# Patient Record
Sex: Female | Born: 1980 | Race: White | Hispanic: No | Marital: Married | Smoking: Never smoker
Health system: Southern US, Community
[De-identification: ages and names within clinical notes are randomized; demographics above are authoritative.]

## PROBLEM LIST (undated history)

## (undated) DIAGNOSIS — R569 Unspecified convulsions: Secondary | ICD-10-CM

## (undated) DIAGNOSIS — J45909 Unspecified asthma, uncomplicated: Secondary | ICD-10-CM

## (undated) DIAGNOSIS — F419 Anxiety disorder, unspecified: Secondary | ICD-10-CM

## (undated) HISTORY — PX: DENTAL SURGERY: SHX609

---

## 2017-10-31 HISTORY — PX: OTHER SURGICAL HISTORY: SHX169

## 2021-01-28 ENCOUNTER — Emergency Department (HOSPITAL_COMMUNITY)
Admission: EM | Admit: 2021-01-28 | Discharge: 2021-01-28 | Disposition: A | Discharge status: Home / Self Care | Payer: BC Managed Care – PPO | Attending: Emergency Medicine | Admitting: Emergency Medicine

## 2021-01-28 ENCOUNTER — Other Ambulatory Visit: Payer: Self-pay

## 2021-01-28 ENCOUNTER — Encounter (HOSPITAL_COMMUNITY): Payer: Self-pay | Admitting: Pharmacy Technician

## 2021-01-28 DIAGNOSIS — R112 Nausea with vomiting, unspecified: Secondary | ICD-10-CM | POA: Insufficient documentation

## 2021-01-28 DIAGNOSIS — J45909 Unspecified asthma, uncomplicated: Secondary | ICD-10-CM | POA: Insufficient documentation

## 2021-01-28 DIAGNOSIS — R1084 Generalized abdominal pain: Secondary | ICD-10-CM | POA: Diagnosis not present

## 2021-01-28 HISTORY — DX: Unspecified asthma, uncomplicated: J45.909

## 2021-01-28 LAB — URINALYSIS, ROUTINE W REFLEX MICROSCOPIC
Bacteria, UA: NONE SEEN
Bilirubin Urine: NEGATIVE
Glucose, UA: NEGATIVE mg/dL
Ketones, ur: 20 mg/dL — AB
Leukocytes,Ua: NEGATIVE
Nitrite: NEGATIVE
Protein, ur: 30 mg/dL — AB
Specific Gravity, Urine: 1.026 (ref 1.005–1.030)
pH: 5 (ref 5.0–8.0)

## 2021-01-28 LAB — CBC
HCT: 38.5 % (ref 36.0–46.0)
Hemoglobin: 11.2 g/dL — ABNORMAL LOW (ref 12.0–15.0)
MCH: 22.8 pg — ABNORMAL LOW (ref 26.0–34.0)
MCHC: 29.1 g/dL — ABNORMAL LOW (ref 30.0–36.0)
MCV: 78.4 fL — ABNORMAL LOW (ref 80.0–100.0)
Platelets: 299 10*3/uL (ref 150–400)
RBC: 4.91 MIL/uL (ref 3.87–5.11)
RDW: 16.5 % — ABNORMAL HIGH (ref 11.5–15.5)
WBC: 8.3 10*3/uL (ref 4.0–10.5)
nRBC: 0 % (ref 0.0–0.2)

## 2021-01-28 LAB — COMPREHENSIVE METABOLIC PANEL
ALT: 13 U/L (ref 0–44)
AST: 16 U/L (ref 15–41)
Albumin: 4 g/dL (ref 3.5–5.0)
Alkaline Phosphatase: 73 U/L (ref 38–126)
Anion gap: 9 (ref 5–15)
BUN: 12 mg/dL (ref 6–20)
CO2: 23 mmol/L (ref 22–32)
Calcium: 8.7 mg/dL — ABNORMAL LOW (ref 8.9–10.3)
Chloride: 105 mmol/L (ref 98–111)
Creatinine, Ser: 0.71 mg/dL (ref 0.44–1.00)
GFR, Estimated: >60 mL/min (ref >60)
Glucose, Bld: 88 mg/dL (ref 70–99)
Potassium: 3.5 mmol/L (ref 3.5–5.1)
Sodium: 137 mmol/L (ref 135–145)
Total Bilirubin: 1.5 mg/dL — ABNORMAL HIGH (ref 0.3–1.2)
Total Protein: 7.1 g/dL (ref 6.5–8.1)

## 2021-01-28 LAB — I-STAT BETA HCG BLOOD, ED (MC, WL, AP ONLY): I-stat hCG, quantitative: <5 m[IU]/mL (ref <5)

## 2021-01-28 LAB — LIPASE, BLOOD: Lipase: 43 U/L (ref 11–51)

## 2021-01-28 MED ORDER — SODIUM CHLORIDE 0.9 % IV BOLUS
1000.0000 mL | Freq: Once | INTRAVENOUS | Status: AC
  Administered 2021-01-28: 1000 mL via INTRAVENOUS

## 2021-01-28 MED ORDER — ONDANSETRON HCL 4 MG/2ML IJ SOLN
4.0000 mg | Freq: Once | INTRAMUSCULAR | Status: AC
  Administered 2021-01-28: 4 mg via INTRAVENOUS
  Filled 2021-01-28: qty 2

## 2021-01-28 MED ORDER — ONDANSETRON 4 MG PO TBDP: 4.0000 mg | ORAL_TABLET | Freq: Three times a day (TID) | ORAL | 0 refills | Status: DC | PRN

## 2021-01-28 NOTE — ED Notes (Signed)
Patient given coke and straw with instructions to sip slowly.

## 2021-01-28 NOTE — ED Notes (Signed)
Patient with poor access, attempted several times to obtain PIV for fluids/meds. Unable to place PIV. PIV team order placed and MD updated.

## 2021-01-28 NOTE — ED Notes (Signed)
Pt tolerated PO fluids

## 2021-01-28 NOTE — ED Notes (Signed)
Pt given a ginger ale. Stated that she did not like the coke.

## 2021-01-28 NOTE — ED Triage Notes (Signed)
Pt here with intermittent abd pain onset Monday. Pt also endorses nausea and vomiting since then. Pt seen at a [facility] and sent here for appendicitis rule out.

## 2021-01-28 NOTE — ED Provider Notes (Signed)
[facility] EMERGENCY DEPARTMENT Provider Note   CSN: [id number] Arrival date & time: 01/28/21  1517     History Chief Complaint  Patient presents with  . Abdominal Pain    Tonya Melton is a 40 y.o. female.  The history is provided by the patient and medical records.  Abdominal Pain  Tonya Melton is a 40 y.o. female who presents to the Emergency Department complaining of vomiting. She presents the emergency department upon referral from the [facility] for evaluation of nausea and vomiting. She began feeling poorly three days ago with nausea and vomiting, three episodes of emesis daily. She reports subjective fevers at home with sweats. No significant abdominal pain. No diarrhea, dysuria, vaginal discharge. She has a history of asthma, no additional medical problems. Just started her cycle today. When [facility] was at [facility] on abdominal examination she did have some right lower quadrant tenderness in which she was referred to the emergency department for possible appendicitis. No known sick contacts. No known bad food exposures. No prior abdominal surgeries.    Past Medical History:  Diagnosis Date  . Asthma     There are no problems to display for this patient.   History reviewed. No pertinent surgical history.   OB History   No obstetric history on file.     No family history on file.     Home Medications Prior to Admission medications   Medication Sig Start Date End Date Taking? Authorizing Provider  ondansetron (ZOFRAN ODT) 4 MG disintegrating tablet Take 1 tablet (4 mg total) by mouth every 8 (eight) hours as needed for nausea or vomiting. 01/28/21  Yes Tilden Fossa, MD    Allergies    Amoxicillin, Doxycycline, and Penicillins  Review of Systems   Review of Systems  Gastrointestinal: Positive for abdominal pain.  All other systems reviewed and are negative.   Physical Exam Updated Vital Signs BP 131/84 (BP Location: Right Arm)    Pulse 77   Temp 98.5 F (36.9 C) (Oral)   Resp 20   SpO2 100%   Physical Exam Vitals and nursing note reviewed.  Constitutional:      Appearance: She is well-developed.  HENT:     Head: Normocephalic and atraumatic.  Cardiovascular:     Rate and Rhythm: Normal rate and regular rhythm.     Heart sounds: No murmur heard.   Pulmonary:     Effort: Pulmonary effort is normal. No respiratory distress.     Breath sounds: Normal breath sounds.  Abdominal:     Palpations: Abdomen is soft.     Tenderness: There is no guarding or rebound.     Comments: Mild generalized abdominal tenderness  Musculoskeletal:        General: No swelling or tenderness.  Skin:    General: Skin is warm and dry.  Neurological:     Mental Status: She is alert and oriented to person, place, and time.  Psychiatric:        Behavior: Behavior normal.     ED Results / Procedures / Treatments   Labs (all labs ordered are listed, but only abnormal results are displayed) Labs Reviewed  COMPREHENSIVE METABOLIC PANEL - Abnormal; Notable for the following components:      Result Value   Calcium 8.7 (*)    Total Bilirubin 1.5 (*)    All other components within normal limits  CBC - Abnormal; Notable for the following components:   Hemoglobin 11.2 (*)  MCV 78.4 (*)    MCH 22.8 (*)    MCHC 29.1 (*)    RDW 16.5 (*)    All other components within normal limits  URINALYSIS, ROUTINE W REFLEX MICROSCOPIC - Abnormal; Notable for the following components:   APPearance HAZY (*)    Hgb urine dipstick MODERATE (*)    Ketones, ur 20 (*)    Protein, ur 30 (*)    All other components within normal limits  LIPASE, BLOOD  I-STAT BETA HCG BLOOD, ED ([facility], [facility], [facility] ONLY)    EKG None  Radiology No results found.  Procedures Procedures   Medications Ordered in ED Medications  sodium chloride 0.9 % bolus 1,000 mL (0 mLs Intravenous Stopped 01/28/21 2106)  ondansetron (ZOFRAN) injection 4 mg (4 mg Intravenous  Given 01/28/21 1941)    ED Course  I have reviewed the triage vital signs and the nursing notes.  Pertinent labs & imaging results that were available during my care of the patient were reviewed by me and considered in my medical decision making (see chart for details).    MDM Rules/Calculators/A&P                         patient here for evaluation of three days of vomiting. She did have abdominal tenderness at urgent care. She has minimal tenderness on examination without any focal peritoneal findings. Labs are reassuring. UA is not consistent with UTI. Feel appendicitis, cholecystitis, or diverticulitis are unlikely at this time. She was treated with antiemetic and IV fluid hydration. On reassessment she is feeling improved, able to tolerate PO. Plan to discharge home with home care for nausea, vomiting. Discussed outpatient follow-up and return precautions.  Final Clinical Impression(s) / ED Diagnoses Final diagnoses:  Non-intractable vomiting with nausea, unspecified vomiting type    Rx / DC Orders ED Discharge Orders         Ordered    ondansetron (ZOFRAN ODT) 4 MG disintegrating tablet  Every 8 hours PRN        01/28/21 2032           Tilden Fossa, MD 01/28/21 2325

## 2021-07-12 ENCOUNTER — Encounter (HOSPITAL_COMMUNITY): Payer: Self-pay

## 2021-07-12 ENCOUNTER — Emergency Department (HOSPITAL_COMMUNITY)
Admission: EM | Admit: 2021-07-12 | Discharge: 2021-07-12 | Disposition: A | Discharge status: Home / Self Care | Payer: Self-pay | Attending: Emergency Medicine | Admitting: Emergency Medicine

## 2021-07-12 ENCOUNTER — Other Ambulatory Visit: Payer: Self-pay

## 2021-07-12 ENCOUNTER — Emergency Department (HOSPITAL_COMMUNITY): Payer: Self-pay

## 2021-07-12 DIAGNOSIS — J069 Acute upper respiratory infection, unspecified: Secondary | ICD-10-CM | POA: Insufficient documentation

## 2021-07-12 DIAGNOSIS — J45909 Unspecified asthma, uncomplicated: Secondary | ICD-10-CM | POA: Insufficient documentation

## 2021-07-12 DIAGNOSIS — Z20822 Contact with and (suspected) exposure to covid-19: Secondary | ICD-10-CM | POA: Insufficient documentation

## 2021-07-12 HISTORY — DX: Anxiety disorder, unspecified: F41.9

## 2021-07-12 LAB — RESP PANEL BY RT-PCR (FLU A&B, COVID) ARPGX2
Influenza A by PCR: NEGATIVE
Influenza B by PCR: NEGATIVE
SARS Coronavirus 2 by RT PCR: NEGATIVE

## 2021-07-12 MED ORDER — ONDANSETRON 4 MG PO TBDP
4.0000 mg | ORAL_TABLET | Freq: Once | ORAL | Status: AC
  Administered 2021-07-12: 4 mg via ORAL
  Filled 2021-07-12: qty 1

## 2021-07-12 MED ORDER — BENZONATATE 100 MG PO CAPS: 100.0000 mg | ORAL_CAPSULE | Freq: Three times a day (TID) | ORAL | 0 refills | Status: DC

## 2021-07-12 MED ORDER — ONDANSETRON 4 MG PO TBDP: 4.0000 mg | ORAL_TABLET | Freq: Three times a day (TID) | ORAL | 0 refills | Status: DC | PRN

## 2021-07-12 NOTE — Discharge Instructions (Addendum)
As we discussed, your chest x-ray showed no evidence of pneumonia. I think your symptoms are likely due a viral respiratory illness. Your COVID and flu tests are pending and should result within the next day or so. If they are positive, please follow CDC guidelines for isolation precautions. You can follow up with your primary care provider if symptoms are persistent.  I'm writing you prescriptions for zofran for nausea and tessalon for cough. You can keep taking cold and flu medicine and mucinex with lots of water.  Continue to monitor your symptoms and return to ED for new or worsening difficulty breathing.

## 2021-07-12 NOTE — ED Provider Notes (Signed)
[facility]-EMERGENCY DEPT Provider Note   CSN: [id number] Arrival date & time: 07/12/21  5170     History Chief Complaint  Patient presents with   Cough   Sore Throat   muscle aches   Headache    Tonya Melton is a 40 y.o. female with hx of asthma presents with productive cough, congestion, sore throat x 3 days. Associated nausea and body aches. Reports pleuritic CP and abdominal soreness. Pt has been taking tylenol cold and flu, mucinex, and home nebulizer without relief. She has felt feverish, highest recorded temperature 99.67F. One episode of vomiting of green mucus. Denies sick contacts. Works as a Manufacturing systems engineer.    Cough Associated symptoms: chest pain, chills, fever, headaches, myalgias, shortness of breath and sore throat   Sore Throat Associated symptoms include chest pain, headaches and shortness of breath.  Headache Associated symptoms: congestion, cough, fever, myalgias, nausea, sore throat and vomiting   Associated symptoms: no diarrhea       Past Medical History:  Diagnosis Date   Anxiety    Asthma     There are no problems to display for this patient.   Past Surgical History:  Procedure Laterality Date   deviated septum  2019     OB History   No obstetric history on file.     History reviewed. No pertinent family history.  Social History   Tobacco Use   Smoking status: Never   Smokeless tobacco: Never  Vaping Use   Vaping Use: Never used  Substance Use Topics   Alcohol use: Yes   Drug use: Never    Home Medications Prior to Admission medications   Medication Sig Start Date End Date Taking? Authorizing Provider  benzonatate (TESSALON) 100 MG capsule Take 1 capsule (100 mg total) by mouth every 8 (eight) hours. 07/12/21  Yes Edwards Mckelvie T, PA-C  ondansetron (ZOFRAN ODT) 4 MG disintegrating tablet Take 1 tablet (4 mg total) by mouth every 8 (eight) hours as needed for nausea or vomiting. 07/12/21  Yes  Tristy Udovich T, PA-C    Allergies    Amoxicillin, Biaxin [clarithromycin], Doxycycline, and Penicillins  Review of Systems   Review of Systems  Constitutional:  Positive for chills and fever.  HENT:  Positive for congestion and sore throat.   Respiratory:  Positive for cough and shortness of breath.   Cardiovascular:  Positive for chest pain.       Pleuritic CP  Gastrointestinal:  Positive for nausea and vomiting. Negative for constipation and diarrhea.  Musculoskeletal:  Positive for myalgias.  Neurological:  Positive for headaches.  All other systems reviewed and are negative.  Physical Exam Updated Vital Signs BP 123/84   Pulse 95   Temp 98.2 F (36.8 C) (Oral)   Resp 18   Ht 5' (1.524 m)   Wt 90.7 kg   LMP 06/21/2021 (Exact Date)   SpO2 98%   BMI 39.06 kg/m   Physical Exam Vitals and nursing note reviewed.  Constitutional:      Appearance: Normal appearance.  HENT:     Head: Normocephalic and atraumatic.     Nose: Congestion present.     Mouth/Throat:     Mouth: Mucous membranes are moist.     Pharynx: No oropharyngeal exudate or posterior oropharyngeal erythema.  Eyes:     Conjunctiva/sclera: Conjunctivae normal.  Cardiovascular:     Rate and Rhythm: Normal rate and regular rhythm.  Pulmonary:     Effort: Pulmonary effort is  normal. No respiratory distress.     Breath sounds: Normal breath sounds.  Abdominal:     General: There is no distension.     Palpations: Abdomen is soft.     Tenderness: There is no abdominal tenderness.  Skin:    General: Skin is warm and dry.  Neurological:     General: No focal deficit present.     Mental Status: She is alert.    ED Results / Procedures / Treatments   Labs (all labs ordered are listed, but only abnormal results are displayed) Labs Reviewed  RESP PANEL BY RT-PCR (FLU A&B, COVID) [id number]    EKG None  Radiology DG Chest 2 View  Result Date: 07/12/2021 CLINICAL DATA:  Cough and shortness of breath.  Nonsmoker with asthma. EXAM: CHEST - 2 VIEW COMPARISON:  None. FINDINGS: Midline trachea.  Normal heart size and mediastinal contours. Sharp costophrenic angles.  No pneumothorax.  Clear lungs. IMPRESSION: No active cardiopulmonary disease. Electronically Signed   By: Jeronimo Greaves M.D.   On: 07/12/2021 13:35    Procedures Procedures   Medications Ordered in ED Medications  ondansetron (ZOFRAN-ODT) disintegrating tablet 4 mg (4 mg Oral Given 07/12/21 1340)    ED Course  I have reviewed the triage vital signs and the nursing notes.  Pertinent labs & imaging results that were available during my care of the patient were reviewed by me and considered in my medical decision making (see chart for details).    MDM Rules/Calculators/A&P                           Patient is 40 y/o female with hx of asthma who presents with 3 days of cough, congestion, sore throat, and body aches. On exam she is afebrile, good air movement with intermittent coughing. Lungs sound clear to auscultation bilaterally. Oxygen saturation 99% on room air.  CXR shows no active cardiopulmonary disease. COVID and flu tests pending. Symptoms likely due to respiratory viral infection. Discussed reassuring imaging, pending test results, and outpatient treatment with over the counter medications. Patient can follow up with PCP if interested in starting paxlovid. Writing prescriptions for tessalon and zofran. Patient is stable for discharge. Discussed reasons to return to ED. Patient agreeable to plan.  Final Clinical Impression(s) / ED Diagnoses Final diagnoses:  Viral URI with cough    Rx / DC Orders ED Discharge Orders          Ordered    benzonatate (TESSALON) 100 MG capsule  Every 8 hours        07/12/21 1354    ondansetron (ZOFRAN ODT) 4 MG disintegrating tablet  Every 8 hours PRN        07/12/21 1354             Andrika Peraza T, PA-C 07/12/21 1358    Wynetta Fines, MD 07/16/21 0900

## 2021-08-07 ENCOUNTER — Other Ambulatory Visit: Payer: Self-pay

## 2021-08-07 ENCOUNTER — Emergency Department (HOSPITAL_COMMUNITY)
Admission: EM | Admit: 2021-08-07 | Discharge: 2021-08-07 | Disposition: A | Discharge status: Home / Self Care | Payer: Self-pay | Attending: Emergency Medicine | Admitting: Emergency Medicine

## 2021-08-07 ENCOUNTER — Encounter (HOSPITAL_COMMUNITY): Payer: Self-pay | Admitting: Emergency Medicine

## 2021-08-07 ENCOUNTER — Emergency Department (HOSPITAL_COMMUNITY): Payer: Self-pay

## 2021-08-07 DIAGNOSIS — D649 Anemia, unspecified: Secondary | ICD-10-CM | POA: Insufficient documentation

## 2021-08-07 DIAGNOSIS — J4541 Moderate persistent asthma with (acute) exacerbation: Secondary | ICD-10-CM | POA: Insufficient documentation

## 2021-08-07 DIAGNOSIS — N9489 Other specified conditions associated with female genital organs and menstrual cycle: Secondary | ICD-10-CM | POA: Insufficient documentation

## 2021-08-07 DIAGNOSIS — J019 Acute sinusitis, unspecified: Secondary | ICD-10-CM | POA: Insufficient documentation

## 2021-08-07 LAB — BASIC METABOLIC PANEL
Anion gap: 9 (ref 5–15)
BUN: 10 mg/dL (ref 6–20)
CO2: 27 mmol/L (ref 22–32)
Calcium: 8.9 mg/dL (ref 8.9–10.3)
Chloride: 102 mmol/L (ref 98–111)
Creatinine, Ser: 0.76 mg/dL (ref 0.44–1.00)
GFR, Estimated: >60 mL/min (ref >60)
Glucose, Bld: 125 mg/dL — ABNORMAL HIGH (ref 70–99)
Potassium: 3.7 mmol/L (ref 3.5–5.1)
Sodium: 138 mmol/L (ref 135–145)

## 2021-08-07 LAB — CBC WITH DIFFERENTIAL/PLATELET
Abs Immature Granulocytes: 0.03 10*3/uL (ref 0.00–0.07)
Basophils Absolute: 0 10*3/uL (ref 0.0–0.1)
Basophils Relative: 0 %
Eosinophils Absolute: 0.3 10*3/uL (ref 0.0–0.5)
Eosinophils Relative: 5 %
HCT: 34 % — ABNORMAL LOW (ref 36.0–46.0)
Hemoglobin: 9.9 g/dL — ABNORMAL LOW (ref 12.0–15.0)
Immature Granulocytes: 1 %
Lymphocytes Relative: 40 %
Lymphs Abs: 2 10*3/uL (ref 0.7–4.0)
MCH: 21.5 pg — ABNORMAL LOW (ref 26.0–34.0)
MCHC: 29.1 g/dL — ABNORMAL LOW (ref 30.0–36.0)
MCV: 73.9 fL — ABNORMAL LOW (ref 80.0–100.0)
Monocytes Absolute: 0.8 10*3/uL (ref 0.1–1.0)
Monocytes Relative: 15 %
Neutro Abs: 2 10*3/uL (ref 1.7–7.7)
Neutrophils Relative %: 39 %
Platelets: 221 10*3/uL (ref 150–400)
RBC: 4.6 MIL/uL (ref 3.87–5.11)
RDW: 17.3 % — ABNORMAL HIGH (ref 11.5–15.5)
WBC: 5.2 10*3/uL (ref 4.0–10.5)
nRBC: 0 % (ref 0.0–0.2)

## 2021-08-07 LAB — I-STAT BETA HCG BLOOD, ED (MC, WL, AP ONLY): I-stat hCG, quantitative: <5 m[IU]/mL (ref <5)

## 2021-08-07 MED ORDER — ALBUTEROL SULFATE HFA 108 (90 BASE) MCG/ACT IN AERS
2.0000 | INHALATION_SPRAY | Freq: Once | RESPIRATORY_TRACT | Status: AC
  Administered 2021-08-07: 2 via RESPIRATORY_TRACT
  Filled 2021-08-07: qty 6.7

## 2021-08-07 MED ORDER — ALBUTEROL SULFATE (2.5 MG/3ML) 0.083% IN NEBU
2.5000 mg | INHALATION_SOLUTION | Freq: Once | RESPIRATORY_TRACT | Status: AC
  Administered 2021-08-07: 2.5 mg via RESPIRATORY_TRACT
  Filled 2021-08-07: qty 3

## 2021-08-07 MED ORDER — METHYLPREDNISOLONE SODIUM SUCC 125 MG IJ SOLR
125.0000 mg | Freq: Once | INTRAMUSCULAR | Status: AC
  Administered 2021-08-07: 125 mg via INTRAMUSCULAR
  Filled 2021-08-07: qty 2

## 2021-08-07 MED ORDER — BENZONATATE 200 MG PO CAPS: 200.0000 mg | ORAL_CAPSULE | Freq: Three times a day (TID) | ORAL | 0 refills | Status: AC

## 2021-08-07 MED ORDER — PREDNISONE 10 MG PO TABS: 50.0000 mg | ORAL_TABLET | Freq: Every day | ORAL | 0 refills | Status: AC

## 2021-08-07 MED ORDER — SULFAMETHOXAZOLE-TRIMETHOPRIM 800-160 MG PO TABS: 1.0000 | ORAL_TABLET | Freq: Two times a day (BID) | ORAL | 0 refills | Status: AC

## 2021-08-07 MED ORDER — ALBUTEROL SULFATE (5 MG/ML) 0.5% IN NEBU: 2.5000 mg | INHALATION_SOLUTION | Freq: Four times a day (QID) | RESPIRATORY_TRACT | 12 refills | Status: DC | PRN

## 2021-08-07 NOTE — ED Provider Notes (Signed)
[facility] EMERGENCY DEPARTMENT Provider Note   CSN: [id number] Arrival date & time: 08/07/21  0556     History Chief Complaint  Patient presents with   Cough / Wheezing     Tonya Melton is a 40 y.o. female.  40 year old female brought in by EMS for cough with wheezing x3 weeks.  Cough is productive, also reports sinus drainage and congestion.  Patient is using her rescue inhaler without relief.  Patient was given a DuoNeb by EMS with limited relief.  Has not had steroids.  She denies fevers, sick contacts.  No other complaints or concerns.      Past Medical History:  Diagnosis Date   Anxiety    Asthma     There are no problems to display for this patient.   Past Surgical History:  Procedure Laterality Date   deviated septum  2019     OB History   No obstetric history on file.     No family history on file.  Social History   Tobacco Use   Smoking status: Never   Smokeless tobacco: Never  Vaping Use   Vaping Use: Never used  Substance Use Topics   Alcohol use: Yes   Drug use: Never    Home Medications Prior to Admission medications   Medication Sig Start Date End Date Taking? Authorizing Provider  albuterol (PROVENTIL) (5 MG/ML) 0.5% nebulizer solution Take 0.5 mLs (2.5 mg total) by nebulization every 6 (six) hours as needed for wheezing or shortness of breath. 08/07/21  Yes Jeannie Fend, PA-C  benzonatate (TESSALON) 200 MG capsule Take 1 capsule (200 mg total) by mouth every 8 (eight) hours for 10 days. 08/07/21 08/17/21 Yes Jeannie Fend, PA-C  predniSONE (DELTASONE) 10 MG tablet Take 5 tablets (50 mg total) by mouth daily for 5 days. 08/07/21 08/12/21 Yes Jeannie Fend, PA-C  sulfamethoxazole-trimethoprim (BACTRIM DS) 800-160 MG tablet Take 1 tablet by mouth 2 (two) times daily for 7 days. 08/07/21 08/14/21 Yes Jeannie Fend, PA-C  ondansetron (ZOFRAN ODT) 4 MG disintegrating tablet Take 1 tablet (4 mg total) by mouth every 8  (eight) hours as needed for nausea or vomiting. 07/12/21   Roemhildt, Lorin T, PA-C    Allergies    Amoxicillin, Biaxin [clarithromycin], Doxycycline, and Penicillins  Review of Systems   Review of Systems  Constitutional:  Negative for chills and fever.  HENT:  Positive for congestion and postnasal drip.   Respiratory:  Positive for cough, shortness of breath and wheezing.   Cardiovascular:  Negative for chest pain.  Gastrointestinal:  Negative for nausea and vomiting.  Musculoskeletal:  Negative for arthralgias and myalgias.  Skin:  Negative for rash and wound.  Allergic/Immunologic: Negative for immunocompromised state.  Neurological:  Negative for weakness.  Hematological:  Negative for adenopathy.  Psychiatric/Behavioral:  Negative for confusion.   All other systems reviewed and are negative.  Physical Exam Updated Vital Signs BP 130/80 (BP Location: Right Arm)   Pulse (!) 104   Temp 98.5 F (36.9 C) (Oral)   Resp 17   Ht 5' (1.524 m)   Wt 108 kg   SpO2 98%   BMI 46.50 kg/m   Physical Exam Vitals and nursing note reviewed.  Constitutional:      General: She is not in acute distress.    Appearance: She is well-developed. She is not diaphoretic.  HENT:     Head: Normocephalic and atraumatic.     Nose: Congestion  present.     Mouth/Throat:     Mouth: Mucous membranes are moist.  Eyes:     Conjunctiva/sclera: Conjunctivae normal.  Cardiovascular:     Rate and Rhythm: Normal rate and regular rhythm.     Heart sounds: Normal heart sounds.  Pulmonary:     Effort: Pulmonary effort is normal.     Breath sounds: Normal breath sounds.  Musculoskeletal:     Cervical back: Neck supple.  Skin:    General: Skin is warm and dry.     Findings: No erythema or rash.  Neurological:     Mental Status: She is alert and oriented to person, place, and time.  Psychiatric:        Behavior: Behavior normal.    ED Results / Procedures / Treatments   Labs (all labs ordered are  listed, but only abnormal results are displayed) Labs Reviewed  CBC WITH DIFFERENTIAL/PLATELET - Abnormal; Notable for the following components:      Result Value   Hemoglobin 9.9 (*)    HCT 34.0 (*)    MCV 73.9 (*)    MCH 21.5 (*)    MCHC 29.1 (*)    RDW 17.3 (*)    All other components within normal limits  BASIC METABOLIC PANEL - Abnormal; Notable for the following components:   Glucose, Bld 125 (*)    All other components within normal limits  I-STAT BETA HCG BLOOD, ED ([facility], [facility], [facility] ONLY)    EKG None  Radiology DG Chest 2 View  Result Date: 08/07/2021 CLINICAL DATA:  40 year old female with productive cough and wheezing for 3 weeks. EXAM: CHEST - 2 VIEW COMPARISON:  Chest radiographs 07/12/2021. FINDINGS: Stable lung volumes overall, lower on the PA view today. Normal cardiac size and mediastinal contours. Visualized tracheal air column is within normal limits. No pneumothorax, pulmonary edema, pleural effusion or confluent pulmonary opacity. Lung markings are within normal limits. No osseous abnormality identified. Negative visible bowel gas pattern. IMPRESSION: Negative.  No acute cardiopulmonary abnormality. Electronically Signed   By: Odessa Fleming M.D.   On: 08/07/2021 07:13    Procedures Procedures   Medications Ordered in ED Medications  albuterol (VENTOLIN HFA) 108 (90 Base) MCG/ACT inhaler 2 puff (2 puffs Inhalation Given 08/07/21 0605)  methylPREDNISolone sodium succinate (SOLU-MEDROL) 125 mg/2 mL injection 125 mg (125 mg Intramuscular Given 08/07/21 0804)  albuterol (PROVENTIL) (2.5 MG/3ML) 0.083% nebulizer solution 2.5 mg (2.5 mg Nebulization Given 08/07/21 0804)    ED Course  I have reviewed the triage vital signs and the nursing notes.  Pertinent labs & imaging results that were available during my care of the patient were reviewed by me and considered in my medical decision making (see chart for details).  Clinical Course as of 08/07/21 1607  Sat Aug 07, 2021  6056  40 year old female with history of asthma with presentation as above.  At time of exam, has frequent dry cough.  Has postnasal drip, lungs are clear to auscultation. Reviewed results with patient, her chest x-ray is unremarkable.  CBC with anemia with hemoglobin of 9.9, previously 11.  hCG negative.  BMP without significant lateral arrangement. Plan is to treat with albuterol inhaler and IM dose of Solu-Medrol and reassess.  If patient is feeling better, she may be discharged with prednisone for the next 5 days, if not improving, may be held for additional evaluation and treatment. [LM]  0831 Repeat lung exam, CTA. Patient appears to be feeling much better. Reports frustration with feeling  sick since 9/10 without improvement. 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 Patient is getting married in 2 weeks, discussed trying to control asthma, limit further viral exposure with mask wearing as much as possible. Multiple drug allergies (is taking Keflex currently without improvement), given Bactrim to take if not improving with discussion that this is likely viral and abx will not be helpful.  [LM]    Clinical Course User Index [LM] Alden Hipp   MDM Rules/Calculators/A&P                            Final Clinical Impression(s) / ED Diagnoses Final diagnoses:  Moderate persistent asthma with exacerbation  Acute non-recurrent sinusitis, unspecified location    Rx / DC Orders ED Discharge Orders          Ordered    predniSONE (DELTASONE) 10 MG tablet  Daily        08/07/21 0829    sulfamethoxazole-trimethoprim (BACTRIM DS) 800-160 MG tablet  2 times daily        08/07/21 0829    benzonatate (TESSALON) 200 MG capsule  Every 8 hours        08/07/21 0829    albuterol (PROVENTIL) (5 MG/ML) 0.5% nebulizer solution  Every 6 hours PRN        08/07/21 0830             Jeannie Fend, PA-C 08/07/21  3007    Gerhard Munch, MD 08/07/21 [phone]

## 2021-08-07 NOTE — ED Triage Notes (Signed)
Patient arrived with EMS from home reports persistent productive cough with wheezing for 3 weeks unrelieved by rescue inhaler at home , denies fever or chills . History of asthma . She received Duoneb treatment by EMS prior to arrival .

## 2021-08-07 NOTE — ED Notes (Signed)
Discharged by PA at triage. 

## 2021-08-07 NOTE — Discharge Instructions (Addendum)
Likely recurrent viral illness due to repeat exposure at work. Recommend prednisone as prescribed and complete the full course. Albuterol nebs as needed as prescribed. Continue with inhaler, singulair, zyrtec, flonase. Saline sinus rinse twice daily. Wear a mask as much as possible at work to limit exposure.  If not improving, take Bactrim as prescribed and complete the full course.  Recheck with your doctor.

## 2021-08-07 NOTE — ED Notes (Signed)
Pt back to triage for PA to reassess.

## 2021-09-24 ENCOUNTER — Emergency Department (HOSPITAL_COMMUNITY)
Admission: EM | Admit: 2021-09-24 | Discharge: 2021-09-25 | Disposition: A | Discharge status: Home / Self Care | Payer: Self-pay | Attending: Medical | Admitting: Medical

## 2021-09-24 ENCOUNTER — Other Ambulatory Visit: Payer: Self-pay

## 2021-09-24 ENCOUNTER — Encounter (HOSPITAL_COMMUNITY): Payer: Self-pay | Admitting: Emergency Medicine

## 2021-09-24 DIAGNOSIS — O26891 Other specified pregnancy related conditions, first trimester: Secondary | ICD-10-CM | POA: Insufficient documentation

## 2021-09-24 DIAGNOSIS — Z5321 Procedure and treatment not carried out due to patient leaving prior to being seen by health care provider: Secondary | ICD-10-CM | POA: Insufficient documentation

## 2021-09-24 DIAGNOSIS — N83201 Unspecified ovarian cyst, right side: Secondary | ICD-10-CM | POA: Diagnosis not present

## 2021-09-24 DIAGNOSIS — Z3A01 Less than 8 weeks gestation of pregnancy: Secondary | ICD-10-CM | POA: Diagnosis not present

## 2021-09-24 DIAGNOSIS — R109 Unspecified abdominal pain: Secondary | ICD-10-CM | POA: Insufficient documentation

## 2021-09-24 DIAGNOSIS — O219 Vomiting of pregnancy, unspecified: Secondary | ICD-10-CM | POA: Diagnosis not present

## 2021-09-24 NOTE — ED Provider Notes (Signed)
Emergency Medicine Provider Triage Evaluation Note  Tonya Melton , a 40 y.o. female  was evaluated in triage.  Pt complains of nausea and non D nonbilious emesis for the past 2 days.  She reports she is approximately [redacted] weeks pregnant.  She states that she has been constipated for approximately 1 week.  She is having small pellet stools however has not had a good BM in over a week.  She has tried Colace without relief.  She does complain of some slight abdominal cramping today however attributes it to feeling bloated and constipated.  This is her first pregnancy.  She has not had a confirmatory ultrasound.  Last menstrual cycle 08/22.  Denies any vaginal bleeding.   Review of Systems  Positive: + abd pain, constipation, nausea, vomiting Negative: - vaginal  bleeding, urinary symptoms  Physical Exam  BP (!) 151/102 (BP Location: Right Arm)   Pulse 93   Temp 98.3 F (36.8 C) (Oral)   Resp 18   Ht 5' (1.524 m)   Wt 108.9 kg   LMP 06/21/2021 (Exact Date)   SpO2 99%   BMI 46.87 kg/m  Gen:   Awake, no distress   Resp:  Normal effort  MSK:   Moves extremities without difficulty  Other:  + mild diffuse abd TTP  Medical Decision Making  Medically screening exam initiated at 11:09 PM.  Appropriate orders placed.  Tonya Melton was informed that the remainder of the evaluation will be completed by another provider, this initial triage assessment does not replace that evaluation, and the importance of remaining in the ED until their evaluation is complete.     Tanda Rockers, PA-C 09/24/21 2309    Dione Booze, MD 09/25/21 [phone]

## 2021-09-24 NOTE — ED Triage Notes (Addendum)
Patient reports she is approximately [redacted] weeks pregnant, reports abdominal cramping and multiple episodes of emesis today. States she not been able to keep fluids down. She reports being concerned about dehydration. Reports no relief with PO zofran.

## 2021-09-25 ENCOUNTER — Emergency Department (HOSPITAL_COMMUNITY): Payer: Self-pay

## 2021-09-25 DIAGNOSIS — N83201 Unspecified ovarian cyst, right side: Secondary | ICD-10-CM | POA: Diagnosis not present

## 2021-09-25 DIAGNOSIS — O26891 Other specified pregnancy related conditions, first trimester: Secondary | ICD-10-CM | POA: Diagnosis not present

## 2021-09-25 DIAGNOSIS — Z3A01 Less than 8 weeks gestation of pregnancy: Secondary | ICD-10-CM | POA: Diagnosis not present

## 2021-09-25 LAB — COMPREHENSIVE METABOLIC PANEL
ALT: 15 U/L (ref 0–44)
AST: 17 U/L (ref 15–41)
Albumin: 4.2 g/dL (ref 3.5–5.0)
Alkaline Phosphatase: 70 U/L (ref 38–126)
Anion gap: 9 (ref 5–15)
BUN: 14 mg/dL (ref 6–20)
CO2: 24 mmol/L (ref 22–32)
Calcium: 8.8 mg/dL — ABNORMAL LOW (ref 8.9–10.3)
Chloride: 104 mmol/L (ref 98–111)
Creatinine, Ser: 0.67 mg/dL (ref 0.44–1.00)
GFR, Estimated: >60 mL/min (ref >60)
Glucose, Bld: 91 mg/dL (ref 70–99)
Potassium: 3.7 mmol/L (ref 3.5–5.1)
Sodium: 137 mmol/L (ref 135–145)
Total Bilirubin: 0.7 mg/dL (ref 0.3–1.2)
Total Protein: 7.6 g/dL (ref 6.5–8.1)

## 2021-09-25 LAB — CBC WITH DIFFERENTIAL/PLATELET
Abs Immature Granulocytes: 0.04 10*3/uL (ref 0.00–0.07)
Basophils Absolute: 0.1 10*3/uL (ref 0.0–0.1)
Basophils Relative: 1 %
Eosinophils Absolute: 0.2 10*3/uL (ref 0.0–0.5)
Eosinophils Relative: 2 %
HCT: 36.2 % (ref 36.0–46.0)
Hemoglobin: 10.8 g/dL — ABNORMAL LOW (ref 12.0–15.0)
Immature Granulocytes: 0 %
Lymphocytes Relative: 29 %
Lymphs Abs: 3 10*3/uL (ref 0.7–4.0)
MCH: 22.5 pg — ABNORMAL LOW (ref 26.0–34.0)
MCHC: 29.8 g/dL — ABNORMAL LOW (ref 30.0–36.0)
MCV: 75.6 fL — ABNORMAL LOW (ref 80.0–100.0)
Monocytes Absolute: 0.9 10*3/uL (ref 0.1–1.0)
Monocytes Relative: 9 %
Neutro Abs: 6.1 10*3/uL (ref 1.7–7.7)
Neutrophils Relative %: 59 %
Platelets: 319 10*3/uL (ref 150–400)
RBC: 4.79 MIL/uL (ref 3.87–5.11)
RDW: 21.1 % — ABNORMAL HIGH (ref 11.5–15.5)
WBC: 10.3 10*3/uL (ref 4.0–10.5)
nRBC: 0 % (ref 0.0–0.2)

## 2021-09-25 LAB — URINALYSIS, ROUTINE W REFLEX MICROSCOPIC
Bilirubin Urine: NEGATIVE
Glucose, UA: NEGATIVE mg/dL
Hgb urine dipstick: NEGATIVE
Ketones, ur: 5 mg/dL — AB
Leukocytes,Ua: NEGATIVE
Nitrite: NEGATIVE
Protein, ur: NEGATIVE mg/dL
Specific Gravity, Urine: 1.024 (ref 1.005–1.030)
pH: 7 (ref 5.0–8.0)

## 2021-09-25 LAB — HCG, QUANTITATIVE, PREGNANCY: hCG, Beta Chain, Quant, S: 22415 m[IU]/mL — ABNORMAL HIGH (ref <5)

## 2021-10-14 ENCOUNTER — Encounter (HOSPITAL_COMMUNITY): Payer: Self-pay

## 2021-10-14 ENCOUNTER — Emergency Department (HOSPITAL_COMMUNITY)
Admission: EM | Admit: 2021-10-14 | Discharge: 2021-10-15 | Disposition: A | Discharge status: Home / Self Care | Payer: Medicaid Other | Attending: Emergency Medicine | Admitting: Emergency Medicine

## 2021-10-14 DIAGNOSIS — O210 Mild hyperemesis gravidarum: Secondary | ICD-10-CM | POA: Diagnosis not present

## 2021-10-14 DIAGNOSIS — Z3A08 8 weeks gestation of pregnancy: Secondary | ICD-10-CM | POA: Diagnosis not present

## 2021-10-14 DIAGNOSIS — R110 Nausea: Secondary | ICD-10-CM | POA: Diagnosis not present

## 2021-10-14 DIAGNOSIS — R102 Pelvic and perineal pain: Secondary | ICD-10-CM

## 2021-10-14 DIAGNOSIS — R531 Weakness: Secondary | ICD-10-CM | POA: Insufficient documentation

## 2021-10-14 DIAGNOSIS — J45909 Unspecified asthma, uncomplicated: Secondary | ICD-10-CM | POA: Diagnosis not present

## 2021-10-14 DIAGNOSIS — R42 Dizziness and giddiness: Secondary | ICD-10-CM | POA: Diagnosis not present

## 2021-10-14 DIAGNOSIS — R55 Syncope and collapse: Secondary | ICD-10-CM | POA: Diagnosis not present

## 2021-10-14 DIAGNOSIS — Z3A01 Less than 8 weeks gestation of pregnancy: Secondary | ICD-10-CM | POA: Diagnosis not present

## 2021-10-14 DIAGNOSIS — O219 Vomiting of pregnancy, unspecified: Secondary | ICD-10-CM | POA: Diagnosis present

## 2021-10-14 DIAGNOSIS — R9431 Abnormal electrocardiogram [ECG] [EKG]: Secondary | ICD-10-CM | POA: Diagnosis not present

## 2021-10-14 DIAGNOSIS — R1030 Lower abdominal pain, unspecified: Secondary | ICD-10-CM | POA: Insufficient documentation

## 2021-10-14 DIAGNOSIS — O0289 Other abnormal products of conception: Secondary | ICD-10-CM | POA: Diagnosis not present

## 2021-10-14 DIAGNOSIS — O3680X0 Pregnancy with inconclusive fetal viability, not applicable or unspecified: Secondary | ICD-10-CM | POA: Diagnosis not present

## 2021-10-14 LAB — CBC WITH DIFFERENTIAL/PLATELET
Abs Immature Granulocytes: 0.04 10*3/uL (ref 0.00–0.07)
Basophils Absolute: 0.1 10*3/uL (ref 0.0–0.1)
Basophils Relative: 1 %
Eosinophils Absolute: 0.1 10*3/uL (ref 0.0–0.5)
Eosinophils Relative: 1 %
HCT: 35.2 % — ABNORMAL LOW (ref 36.0–46.0)
Hemoglobin: 10.3 g/dL — ABNORMAL LOW (ref 12.0–15.0)
Immature Granulocytes: 0 %
Lymphocytes Relative: 25 %
Lymphs Abs: 2.7 10*3/uL (ref 0.7–4.0)
MCH: 22.6 pg — ABNORMAL LOW (ref 26.0–34.0)
MCHC: 29.3 g/dL — ABNORMAL LOW (ref 30.0–36.0)
MCV: 77.4 fL — ABNORMAL LOW (ref 80.0–100.0)
Monocytes Absolute: 1.1 10*3/uL — ABNORMAL HIGH (ref 0.1–1.0)
Monocytes Relative: 10 %
Neutro Abs: 6.9 10*3/uL (ref 1.7–7.7)
Neutrophils Relative %: 63 %
Platelets: 258 10*3/uL (ref 150–400)
RBC: 4.55 MIL/uL (ref 3.87–5.11)
RDW: 20.9 % — ABNORMAL HIGH (ref 11.5–15.5)
WBC: 10.8 10*3/uL — ABNORMAL HIGH (ref 4.0–10.5)
nRBC: 0 % (ref 0.0–0.2)

## 2021-10-14 NOTE — ED Triage Notes (Signed)
Pt BIB [facility] for eval of syncope and nausea. Pt reports she had a possible syncopal episode this evening, [redacted] weeks pregnant. Reports poor PO intake, morning sickness as well. GCS 15 for EMS. EKG unremarkable

## 2021-10-15 ENCOUNTER — Emergency Department (HOSPITAL_COMMUNITY): Payer: Medicaid Other

## 2021-10-15 DIAGNOSIS — Z3A08 8 weeks gestation of pregnancy: Secondary | ICD-10-CM | POA: Diagnosis not present

## 2021-10-15 DIAGNOSIS — O3680X0 Pregnancy with inconclusive fetal viability, not applicable or unspecified: Secondary | ICD-10-CM | POA: Diagnosis not present

## 2021-10-15 LAB — BASIC METABOLIC PANEL
Anion gap: 8 (ref 5–15)
BUN: 11 mg/dL (ref 6–20)
CO2: 23 mmol/L (ref 22–32)
Calcium: 8.7 mg/dL — ABNORMAL LOW (ref 8.9–10.3)
Chloride: 105 mmol/L (ref 98–111)
Creatinine, Ser: 0.67 mg/dL (ref 0.44–1.00)
GFR, Estimated: >60 mL/min (ref >60)
Glucose, Bld: 79 mg/dL (ref 70–99)
Potassium: 3.3 mmol/L — ABNORMAL LOW (ref 3.5–5.1)
Sodium: 136 mmol/L (ref 135–145)

## 2021-10-15 LAB — URINALYSIS, ROUTINE W REFLEX MICROSCOPIC
Bilirubin Urine: NEGATIVE
Glucose, UA: NEGATIVE mg/dL
Hgb urine dipstick: NEGATIVE
Ketones, ur: >80 mg/dL — AB
Leukocytes,Ua: NEGATIVE
Nitrite: NEGATIVE
Protein, ur: NEGATIVE mg/dL
Specific Gravity, Urine: 1.025 (ref 1.005–1.030)
pH: 6 (ref 5.0–8.0)

## 2021-10-15 LAB — I-STAT BETA HCG BLOOD, ED (MC, WL, AP ONLY): I-stat hCG, quantitative: >2000 m[IU]/mL — ABNORMAL HIGH (ref <5)

## 2021-10-15 MED ORDER — PROMETHAZINE HCL 25 MG PO TABS: 25.0000 mg | ORAL_TABLET | Freq: Every evening | ORAL | 0 refills | Status: DC | PRN

## 2021-10-15 MED ORDER — SODIUM CHLORIDE 0.9 % IV SOLN
12.5000 mg | Freq: Once | INTRAVENOUS | Status: AC
  Administered 2021-10-15: 12.5 mg via INTRAVENOUS
  Filled 2021-10-15: qty 0.5

## 2021-10-15 MED ORDER — METOCLOPRAMIDE HCL 10 MG PO TABS: 10.0000 mg | ORAL_TABLET | Freq: Four times a day (QID) | ORAL | 0 refills | Status: DC | PRN

## 2021-10-15 MED ORDER — SODIUM CHLORIDE 0.9 % IV BOLUS
2000.0000 mL | Freq: Once | INTRAVENOUS | Status: AC
  Administered 2021-10-15: 2000 mL via INTRAVENOUS

## 2021-10-15 NOTE — Discharge Instructions (Addendum)
Use Reglan 10 mg every 6 hours during the day. Use the Phenergan 25 mg tablet at night before bed. You can use Phenergan during the day if Reglan does not control symptoms. **You can also use Phenergan intravaginally (1 tablet inserted into the vagina) every 6 hours if you are not tolerating anything by mouth.   If these treatments fail to control nausea/vomiting and you cannot eat or drink, that is an indication to return to the emergency department. Please be aware there is a Maternity Admissions Unit to treat pregnancy concerns such as uncontrolled vomiting that is located inside [facility].

## 2021-10-15 NOTE — ED Provider Notes (Signed)
[facility] EMERGENCY DEPARTMENT Provider Note   CSN: [id number] Arrival date & time: 10/14/21  1847     History Chief Complaint  Patient presents with   Loss of Consciousness   Nausea    Tonya Melton is a 40 y.o. female.  Patient to ED with persistent nausea and vomiting of pregnancy, currently 8 weeks. G1P0. She reports having significant dizziness and tonight had a syncopal episode while standing. She has been vomiting, unable to hold down any significant amount of solids or fluids. She reports dark urine and burning sensation with decreased urine output. She reports constipation as well. She has mild abdominal pain across the lower abdomen, greater on the left side. She reports being seen at [facility] about 3 weeks ago for same but the wait was too long and she ended up going home before obtaining test results.   The history is provided by the patient and the spouse. No language interpreter was used.  Loss of Consciousness Associated symptoms: nausea, vomiting and weakness   Associated symptoms: no fever       Past Medical History:  Diagnosis Date   Anxiety    Asthma     There are no problems to display for this patient.   Past Surgical History:  Procedure Laterality Date   deviated septum  2019     OB History     Gravida  1   Para      Term      Preterm      AB      Living         SAB      IAB      Ectopic      Multiple      Live Births              History reviewed. No pertinent family history.  Social History   Tobacco Use   Smoking status: Never   Smokeless tobacco: Never  Vaping Use   Vaping Use: Never used  Substance Use Topics   Alcohol use: Yes   Drug use: Never    Home Medications Prior to Admission medications   Medication Sig Start Date End Date Taking? Authorizing Provider  albuterol (PROVENTIL) (5 MG/ML) 0.5% nebulizer solution Take 0.5 mLs (2.5 mg total) by nebulization every 6  (six) hours as needed for wheezing or shortness of breath. 08/07/21   Jeannie Fend, PA-C  ondansetron (ZOFRAN ODT) 4 MG disintegrating tablet Take 1 tablet (4 mg total) by mouth every 8 (eight) hours as needed for nausea or vomiting. 07/12/21   Roemhildt, Lorin T, PA-C    Allergies    Amoxicillin, Biaxin [clarithromycin], Doxycycline, and Penicillins  Review of Systems   Review of Systems  Constitutional:  Negative for chills and fever.  HENT: Negative.    Respiratory: Negative.    Cardiovascular:  Positive for syncope.  Gastrointestinal:  Positive for abdominal pain, constipation, nausea and vomiting.  Genitourinary:  Positive for decreased urine volume and dysuria.  Musculoskeletal: Negative.   Skin: Negative.   Neurological:  Positive for syncope and weakness.   Physical Exam Updated Vital Signs BP 105/64 (BP Location: Right Arm)    Pulse (!) 52    Temp 98.5 F (36.9 C) (Oral)    Resp 20    Ht 5' (1.524 m)    Wt 109 kg    LMP 06/21/2021 (Exact Date)    SpO2 100%    BMI 46.93  kg/m   Physical Exam Vitals and nursing note reviewed.  Constitutional:      Appearance: She is well-developed.  HENT:     Head: Normocephalic.  Cardiovascular:     Rate and Rhythm: Normal rate and regular rhythm.     Heart sounds: No murmur heard. Pulmonary:     Effort: Pulmonary effort is normal.     Breath sounds: Normal breath sounds. No wheezing, rhonchi or rales.  Abdominal:     Palpations: Abdomen is soft.     Tenderness: There is abdominal tenderness (Across lower abdomen and in epigastric region). There is no guarding or rebound.  Musculoskeletal:        General: Normal range of motion.     Cervical back: Normal range of motion and neck supple.  Skin:    General: Skin is warm and dry.  Neurological:     General: No focal deficit present.     Mental Status: She is alert and oriented to person, place, and time.    ED Results / Procedures / Treatments   Labs (all labs ordered are  listed, but only abnormal results are displayed) Labs Reviewed  CBC WITH DIFFERENTIAL/PLATELET - Abnormal; Notable for the following components:      Result Value   WBC 10.8 (*)    Hemoglobin 10.3 (*)    HCT 35.2 (*)    MCV 77.4 (*)    MCH 22.6 (*)    MCHC 29.3 (*)    RDW 20.9 (*)    Monocytes Absolute 1.1 (*)    All other components within normal limits  BASIC METABOLIC PANEL  URINALYSIS, ROUTINE W REFLEX MICROSCOPIC  I-STAT BETA HCG BLOOD, ED (MC, [facility], AP ONLY)    EKG EKG Interpretation  Date/Time:  Thursday October 14 2021 19:05:23 EST Ventricular Rate:  56 PR Interval:  150 QRS Duration: 78 QT Interval:  442 QTC Calculation: 426 R Axis:   57 Text Interpretation: Sinus bradycardia Otherwise normal ECG Confirmed by Tilden Fossa [phone]) on 10/14/2021 10:56:29 PM  Radiology No results found.  Procedures Procedures   Medications Ordered in ED Medications  sodium chloride 0.9 % bolus 2,000 mL (has no administration in time range)  promethazine (PHENERGAN) 12.5 mg in sodium chloride 0.9 % 50 mL IVPB (has no administration in time range)    ED Course  I have reviewed the triage vital signs and the nursing notes.  Pertinent labs & imaging results that were available during my care of the patient were reviewed by me and considered in my medical decision making (see chart for details).    MDM Rules/Calculators/A&P                         Patient to ED with excessive vomiting, lightheadedness, approximately 8 weeks into her first pregnancy, tonight with syncopal episode.   IVF's started. EKG NSR. Suspect dehydration causing vasovagal syncope. No chest pain. Labs pending.   Patient reports feeling much better as IVF's are running. Phenergan provided for nausea. Will provide PO challenge and recheck.   2 L fluids have completed. She reports having to urinate. No further vomiting after small PO challenge. US shows 8w 3d IUP with cardiac activity. Patient updated on  results.   Feel she is appropriate for discharge home. Will Rx Phenergan 25 mg tablet (nightime), reglan 10 mg (daytime). Discussed return precautions.         Final Clinical Impression(s) / ED Diagnoses Final diagnoses:  None  Hyperemesis gravidarum   Rx / DC Orders ED Discharge Orders     None        Danne Harbor 10/15/21 0410    Tilden Fossa, MD 10/15/21 [phone]

## 2021-10-29 DIAGNOSIS — O99211 Obesity complicating pregnancy, first trimester: Secondary | ICD-10-CM | POA: Diagnosis not present

## 2021-10-29 DIAGNOSIS — Z3A10 10 weeks gestation of pregnancy: Secondary | ICD-10-CM | POA: Diagnosis not present

## 2021-10-29 DIAGNOSIS — E669 Obesity, unspecified: Secondary | ICD-10-CM | POA: Diagnosis not present

## 2021-10-29 DIAGNOSIS — J45909 Unspecified asthma, uncomplicated: Secondary | ICD-10-CM | POA: Diagnosis not present

## 2021-10-29 DIAGNOSIS — O99511 Diseases of the respiratory system complicating pregnancy, first trimester: Secondary | ICD-10-CM | POA: Diagnosis not present

## 2021-10-29 DIAGNOSIS — O99513 Diseases of the respiratory system complicating pregnancy, third trimester: Secondary | ICD-10-CM | POA: Diagnosis not present

## 2021-10-29 DIAGNOSIS — O09521 Supervision of elderly multigravida, first trimester: Secondary | ICD-10-CM | POA: Diagnosis not present

## 2021-10-29 DIAGNOSIS — O210 Mild hyperemesis gravidarum: Secondary | ICD-10-CM | POA: Diagnosis not present

## 2021-10-29 DIAGNOSIS — O99341 Other mental disorders complicating pregnancy, first trimester: Secondary | ICD-10-CM | POA: Diagnosis not present

## 2021-10-29 DIAGNOSIS — Z3689 Encounter for other specified antenatal screening: Secondary | ICD-10-CM | POA: Diagnosis not present

## 2021-10-29 DIAGNOSIS — F419 Anxiety disorder, unspecified: Secondary | ICD-10-CM | POA: Diagnosis not present

## 2021-11-08 ENCOUNTER — Inpatient Hospital Stay (HOSPITAL_COMMUNITY): Payer: Medicaid Other

## 2021-11-08 ENCOUNTER — Encounter (HOSPITAL_COMMUNITY): Payer: Self-pay | Admitting: Obstetrics and Gynecology

## 2021-11-08 ENCOUNTER — Inpatient Hospital Stay (HOSPITAL_COMMUNITY)
Admission: AD | Admit: 2021-11-08 | Discharge: 2021-11-08 | Disposition: A | Discharge status: Home / Self Care | Payer: Medicaid Other | Attending: Obstetrics and Gynecology | Admitting: Obstetrics and Gynecology

## 2021-11-08 ENCOUNTER — Other Ambulatory Visit: Payer: Self-pay

## 2021-11-08 DIAGNOSIS — IMO0001 Reserved for inherently not codable concepts without codable children: Secondary | ICD-10-CM

## 2021-11-08 DIAGNOSIS — O219 Vomiting of pregnancy, unspecified: Secondary | ICD-10-CM | POA: Diagnosis not present

## 2021-11-08 DIAGNOSIS — O209 Hemorrhage in early pregnancy, unspecified: Secondary | ICD-10-CM | POA: Diagnosis not present

## 2021-11-08 DIAGNOSIS — O039 Complete or unspecified spontaneous abortion without complication: Secondary | ICD-10-CM | POA: Diagnosis not present

## 2021-11-08 DIAGNOSIS — O3481 Maternal care for other abnormalities of pelvic organs, first trimester: Secondary | ICD-10-CM | POA: Insufficient documentation

## 2021-11-08 DIAGNOSIS — R1111 Vomiting without nausea: Secondary | ICD-10-CM | POA: Diagnosis not present

## 2021-11-08 DIAGNOSIS — N83291 Other ovarian cyst, right side: Secondary | ICD-10-CM | POA: Diagnosis not present

## 2021-11-08 DIAGNOSIS — O208 Other hemorrhage in early pregnancy: Secondary | ICD-10-CM | POA: Insufficient documentation

## 2021-11-08 DIAGNOSIS — N83201 Unspecified ovarian cyst, right side: Secondary | ICD-10-CM | POA: Diagnosis not present

## 2021-11-08 DIAGNOSIS — Z3A12 12 weeks gestation of pregnancy: Secondary | ICD-10-CM | POA: Diagnosis not present

## 2021-11-08 DIAGNOSIS — R1084 Generalized abdominal pain: Secondary | ICD-10-CM | POA: Diagnosis not present

## 2021-11-08 DIAGNOSIS — N939 Abnormal uterine and vaginal bleeding, unspecified: Secondary | ICD-10-CM | POA: Diagnosis not present

## 2021-11-08 DIAGNOSIS — Z880 Allergy status to penicillin: Secondary | ICD-10-CM | POA: Diagnosis not present

## 2021-11-08 DIAGNOSIS — R110 Nausea: Secondary | ICD-10-CM | POA: Diagnosis not present

## 2021-11-08 DIAGNOSIS — Z3491 Encounter for supervision of normal pregnancy, unspecified, first trimester: Secondary | ICD-10-CM

## 2021-11-08 DIAGNOSIS — O468X1 Other antepartum hemorrhage, first trimester: Secondary | ICD-10-CM

## 2021-11-08 DIAGNOSIS — Z3A11 11 weeks gestation of pregnancy: Secondary | ICD-10-CM | POA: Diagnosis not present

## 2021-11-08 DIAGNOSIS — R100 Acute abdomen: Secondary | ICD-10-CM | POA: Diagnosis not present

## 2021-11-08 MED ORDER — PROMETHAZINE HCL 25 MG PO TABS
25.0000 mg | ORAL_TABLET | Freq: Every evening | ORAL | 2 refills | Status: DC | PRN
Start: 2021-11-08 — End: 2022-02-27

## 2021-11-08 MED ORDER — ONDANSETRON 4 MG PO TBDP
8.0000 mg | ORAL_TABLET | Freq: Once | ORAL | Status: AC
  Administered 2021-11-08: 8 mg via ORAL
  Filled 2021-11-08: qty 2

## 2021-11-08 NOTE — MAU Note (Addendum)
Pt presents to MAU with c/o vaginal bleeding and cramping. Pt states that she was stood up from the couch around 1930 and begin to experience bleeding with clots. Pt also states that she has had cramping throughout the day but has intensified. Pain: 2-3/10.

## 2021-11-08 NOTE — MAU Provider Note (Signed)
Chief Complaint:  Vaginal Bleeding   Event Date/Time   First Provider Initiated Contact with Patient 11/08/21 2132     HPI: Tonya Melton is a 41 y.o. G1P0 at [redacted]w[redacted]d who presents to maternity admissions reporting vaginal bleeding and cramping. Began at 1930 and has had a few small clots, soaked a large pad and has some mild cramping. Established care at [facility], had an ultrasound on 10/29/21 but does not recall anything abnormal about the ultrasound. No recent IC or vaginal discharge. Having some nausea/vomiting and has phenergan but has been placing it vaginally. No other physical complaints, is very concerned about the amount of bleeding and viability of the pregnancy.  Pregnancy Course: Uncomplicated so far, prenatal records reviewed. SIUP seen on 10/29/21 with [facility] (records state pt was given bleeding precautions but she does not recall). A positive blood type.  Past Medical History:  Diagnosis Date   Anxiety    Asthma    OB History  Gravida Para Term Preterm AB Living  1            SAB IAB Ectopic Multiple Live Births               # Outcome Date GA Lbr Len/2nd Weight Sex Delivery Anes PTL Lv  1 Current            Past Surgical History:  Procedure Laterality Date   deviated septum  2019   No family history on file. Social History   Tobacco Use   Smoking status: Never   Smokeless tobacco: Never  Vaping Use   Vaping Use: Never used  Substance Use Topics   Alcohol use: Yes   Drug use: Never   Allergies  Allergen Reactions   Amoxicillin    Biaxin [Clarithromycin]    Doxycycline    Penicillins    No medications prior to admission.   I have reviewed patient's Past Medical Hx, Surgical Hx, Family Hx, Social Hx, medications and allergies.   ROS:  Pertinent items noted in HPI and remainder of comprehensive ROS otherwise negative.   Physical Exam  Patient Vitals for the past 24 hrs:  BP Temp Temp src Pulse Resp SpO2 Height Weight  11/08/21  2226 124/73 98.1 F (36.7 C) Oral 75 18 -- -- --  11/08/21 2053 125/74 -- -- 73 18 100 % -- --  11/08/21 2050 125/74 98.6 F (37 C) Oral 85 18 99 % 5' (1.524 m) 189 lb 11.2 oz (86 kg)   Constitutional: Well-developed, well-nourished female in no acute distress.  Cardiovascular: normal rate & rhythm Respiratory: normal effort GI: Abd soft, non-tender MS: Extremities nontender, no edema, normal ROM Neurologic: Alert and oriented x 4.  GU: no CVA tenderness Pelvic: exam deferred, bright red blood noted on pad, RN unable to doppler FHT. Pt sent to U/S   Labs: No results found for this or any previous visit (from the past 24 hour(s)).  Imaging:  US OB Comp Less 14 Wks  Result Date: 11/08/2021 CLINICAL DATA:  Vaginal bleeding. EXAM: OBSTETRIC <14 WK ULTRASOUND TECHNIQUE: Transabdominal ultrasound was performed for evaluation of the gestation as well as the maternal uterus and adnexal regions. COMPARISON:  Obstetrical ultrasound 10/15/2021. FINDINGS: Intrauterine gestational sac: Single Yolk sac:  Not Visualized. Embryo:  Visualized. Cardiac Activity: Visualized. Heart Rate: 162 bpm CRL:   51.3 mm   11 w 6 d  [place] EDC: 05/24/2022 Subchorionic hemorrhage:  None visualized. Maternal uterus/adnexae: There is a 3.4 x 2.8 x 3.1 cm simple cyst in the right ovary. The left ovary is not visualized. There is no pelvic free fluid. IMPRESSION: 1. Single live intrauterine gestation measuring 11 weeks 6 days by crown-rump length. 2. No subchorionic hemorrhage. 3. 3.4 cm right ovarian simple cyst. Recommend attention on follow-up exams. Electronically Signed   By: Darliss Cheney M.D.   On: 11/08/2021 22:00    MAU Course: Orders Placed This Encounter  Procedures   US OB Comp Less 14 Wks   Discharge patient   Meds ordered this encounter  Medications   ondansetron (ZOFRAN-ODT) disintegrating tablet 8 mg   promethazine (PHENERGAN) 25 MG tablet    Sig: Take 1 tablet (25 mg total) by mouth at  bedtime as needed for nausea or vomiting.    Dispense:  30 tablet    Refill:  2    Order Specific Question:   Supervising Provider    Answer:   Samara Snide   MDM: Discussed presence of SCH seen on 10/29/21 and absence of such on today's ultrasound. Bleeding likely from release of [facility]. FHT noted of 162. Pt reassured and showed ultrasound images. Strong bleeding precautions given, recommended complete pelvic rest x2wks and rest for the next 24hrs. Pt expressed relief and understanding.   Zofran given for ride home, phenergan refilled so patient can take orally until end of pelvic rest.  Assessment: 1. Vaginal bleeding in pregnancy, first trimester   2. Vaginal bleeding affecting early pregnancy   3. [redacted] weeks gestation of pregnancy   4. Nausea/vomiting in pregnancy   5. Subchorionic hemorrhage of placenta in first trimester, single or unspecified fetus   6. Fetal heart tones present, first trimester    Plan: Discharge home in stable condition with bleeding precautions     Follow-up Information     [facility] Follow up.   Why: as scheduled for ongoing prenatal care                Allergies as of 11/08/2021       Reactions   Amoxicillin    Biaxin [clarithromycin]    Doxycycline    Penicillins         Medication List     TAKE these medications    albuterol (5 MG/ML) 0.5% nebulizer solution Commonly known as: PROVENTIL Take 0.5 mLs (2.5 mg total) by nebulization every 6 (six) hours as needed for wheezing or shortness of breath.   metoCLOPramide 10 MG tablet Commonly known as: REGLAN Take 1 tablet (10 mg total) by mouth every 6 (six) hours as needed for nausea.   ondansetron 4 MG disintegrating tablet Commonly known as: Zofran ODT Take 1 tablet (4 mg total) by mouth every 8 (eight) hours as needed for nausea or vomiting.   promethazine 25 MG tablet Commonly known as: PHENERGAN Take 1 tablet (25 mg total) by mouth at bedtime as  needed for nausea or vomiting.       Edd Arbour, CNM, MSN, IBCLC Certified Nurse Midwife, [facility]

## 2021-11-12 DIAGNOSIS — E669 Obesity, unspecified: Secondary | ICD-10-CM | POA: Diagnosis not present

## 2021-11-12 DIAGNOSIS — O99011 Anemia complicating pregnancy, first trimester: Secondary | ICD-10-CM | POA: Diagnosis not present

## 2021-11-12 DIAGNOSIS — Z3689 Encounter for other specified antenatal screening: Secondary | ICD-10-CM | POA: Diagnosis not present

## 2021-11-12 DIAGNOSIS — Z3A12 12 weeks gestation of pregnancy: Secondary | ICD-10-CM | POA: Diagnosis not present

## 2021-11-12 DIAGNOSIS — O99511 Diseases of the respiratory system complicating pregnancy, first trimester: Secondary | ICD-10-CM | POA: Diagnosis not present

## 2021-11-12 DIAGNOSIS — F419 Anxiety disorder, unspecified: Secondary | ICD-10-CM | POA: Diagnosis not present

## 2021-11-12 DIAGNOSIS — D649 Anemia, unspecified: Secondary | ICD-10-CM | POA: Diagnosis not present

## 2021-11-12 DIAGNOSIS — O99341 Other mental disorders complicating pregnancy, first trimester: Secondary | ICD-10-CM | POA: Diagnosis not present

## 2021-11-12 DIAGNOSIS — O99211 Obesity complicating pregnancy, first trimester: Secondary | ICD-10-CM | POA: Diagnosis not present

## 2021-11-12 DIAGNOSIS — O210 Mild hyperemesis gravidarum: Secondary | ICD-10-CM | POA: Diagnosis not present

## 2021-11-12 DIAGNOSIS — O09511 Supervision of elderly primigravida, first trimester: Secondary | ICD-10-CM | POA: Diagnosis not present

## 2021-11-12 DIAGNOSIS — J45909 Unspecified asthma, uncomplicated: Secondary | ICD-10-CM | POA: Diagnosis not present

## 2021-12-31 DIAGNOSIS — D649 Anemia, unspecified: Secondary | ICD-10-CM | POA: Diagnosis not present

## 2021-12-31 DIAGNOSIS — Z361 Encounter for antenatal screening for raised alphafetoprotein level: Secondary | ICD-10-CM | POA: Diagnosis not present

## 2021-12-31 DIAGNOSIS — O210 Mild hyperemesis gravidarum: Secondary | ICD-10-CM | POA: Diagnosis not present

## 2021-12-31 DIAGNOSIS — O99512 Diseases of the respiratory system complicating pregnancy, second trimester: Secondary | ICD-10-CM | POA: Diagnosis not present

## 2021-12-31 DIAGNOSIS — O99352 Diseases of the nervous system complicating pregnancy, second trimester: Secondary | ICD-10-CM | POA: Diagnosis not present

## 2021-12-31 DIAGNOSIS — J45909 Unspecified asthma, uncomplicated: Secondary | ICD-10-CM | POA: Diagnosis not present

## 2021-12-31 DIAGNOSIS — O99342 Other mental disorders complicating pregnancy, second trimester: Secondary | ICD-10-CM | POA: Diagnosis not present

## 2021-12-31 DIAGNOSIS — O09512 Supervision of elderly primigravida, second trimester: Secondary | ICD-10-CM | POA: Diagnosis not present

## 2021-12-31 DIAGNOSIS — Z363 Encounter for antenatal screening for malformations: Secondary | ICD-10-CM | POA: Diagnosis not present

## 2021-12-31 DIAGNOSIS — O321XX0 Maternal care for breech presentation, not applicable or unspecified: Secondary | ICD-10-CM | POA: Diagnosis not present

## 2021-12-31 DIAGNOSIS — Z3A19 19 weeks gestation of pregnancy: Secondary | ICD-10-CM | POA: Diagnosis not present

## 2021-12-31 DIAGNOSIS — F419 Anxiety disorder, unspecified: Secondary | ICD-10-CM | POA: Diagnosis not present

## 2021-12-31 DIAGNOSIS — O99012 Anemia complicating pregnancy, second trimester: Secondary | ICD-10-CM | POA: Diagnosis not present

## 2021-12-31 DIAGNOSIS — E668 Other obesity: Secondary | ICD-10-CM | POA: Diagnosis not present

## 2021-12-31 DIAGNOSIS — G43019 Migraine without aura, intractable, without status migrainosus: Secondary | ICD-10-CM | POA: Diagnosis not present

## 2021-12-31 DIAGNOSIS — O99212 Obesity complicating pregnancy, second trimester: Secondary | ICD-10-CM | POA: Diagnosis not present

## 2021-12-31 DIAGNOSIS — Z36 Encounter for antenatal screening for chromosomal anomalies: Secondary | ICD-10-CM | POA: Diagnosis not present

## 2021-12-31 DIAGNOSIS — O358XX0 Maternal care for other (suspected) fetal abnormality and damage, not applicable or unspecified: Secondary | ICD-10-CM | POA: Diagnosis not present

## 2022-01-04 DIAGNOSIS — Z79899 Other long term (current) drug therapy: Secondary | ICD-10-CM | POA: Diagnosis not present

## 2022-01-04 DIAGNOSIS — F411 Generalized anxiety disorder: Secondary | ICD-10-CM | POA: Diagnosis not present

## 2022-01-04 DIAGNOSIS — F4321 Adjustment disorder with depressed mood: Secondary | ICD-10-CM | POA: Diagnosis not present

## 2022-01-17 DIAGNOSIS — Z3A22 22 weeks gestation of pregnancy: Secondary | ICD-10-CM | POA: Diagnosis not present

## 2022-01-17 DIAGNOSIS — F4321 Adjustment disorder with depressed mood: Secondary | ICD-10-CM | POA: Diagnosis not present

## 2022-01-17 DIAGNOSIS — O99342 Other mental disorders complicating pregnancy, second trimester: Secondary | ICD-10-CM | POA: Diagnosis not present

## 2022-01-17 DIAGNOSIS — F411 Generalized anxiety disorder: Secondary | ICD-10-CM | POA: Diagnosis not present

## 2022-01-17 DIAGNOSIS — Z79899 Other long term (current) drug therapy: Secondary | ICD-10-CM | POA: Diagnosis not present

## 2022-01-17 DIAGNOSIS — O09512 Supervision of elderly primigravida, second trimester: Secondary | ICD-10-CM | POA: Diagnosis not present

## 2022-01-25 DIAGNOSIS — O99712 Diseases of the skin and subcutaneous tissue complicating pregnancy, second trimester: Secondary | ICD-10-CM | POA: Diagnosis not present

## 2022-01-25 DIAGNOSIS — O99352 Diseases of the nervous system complicating pregnancy, second trimester: Secondary | ICD-10-CM | POA: Diagnosis not present

## 2022-01-25 DIAGNOSIS — F419 Anxiety disorder, unspecified: Secondary | ICD-10-CM | POA: Diagnosis not present

## 2022-01-25 DIAGNOSIS — R0981 Nasal congestion: Secondary | ICD-10-CM | POA: Diagnosis not present

## 2022-01-25 DIAGNOSIS — G43019 Migraine without aura, intractable, without status migrainosus: Secondary | ICD-10-CM | POA: Diagnosis not present

## 2022-01-25 DIAGNOSIS — O210 Mild hyperemesis gravidarum: Secondary | ICD-10-CM | POA: Diagnosis not present

## 2022-01-25 DIAGNOSIS — Z3A23 23 weeks gestation of pregnancy: Secondary | ICD-10-CM | POA: Diagnosis not present

## 2022-01-25 DIAGNOSIS — O99342 Other mental disorders complicating pregnancy, second trimester: Secondary | ICD-10-CM | POA: Diagnosis not present

## 2022-01-25 DIAGNOSIS — O99512 Diseases of the respiratory system complicating pregnancy, second trimester: Secondary | ICD-10-CM | POA: Diagnosis not present

## 2022-01-25 DIAGNOSIS — Z79899 Other long term (current) drug therapy: Secondary | ICD-10-CM | POA: Diagnosis not present

## 2022-01-25 DIAGNOSIS — L720 Epidermal cyst: Secondary | ICD-10-CM | POA: Diagnosis not present

## 2022-02-01 DIAGNOSIS — Z79899 Other long term (current) drug therapy: Secondary | ICD-10-CM | POA: Diagnosis not present

## 2022-02-01 DIAGNOSIS — O09522 Supervision of elderly multigravida, second trimester: Secondary | ICD-10-CM | POA: Diagnosis not present

## 2022-02-01 DIAGNOSIS — F411 Generalized anxiety disorder: Secondary | ICD-10-CM | POA: Diagnosis not present

## 2022-02-01 DIAGNOSIS — F4321 Adjustment disorder with depressed mood: Secondary | ICD-10-CM | POA: Diagnosis not present

## 2022-02-01 DIAGNOSIS — Z3A24 24 weeks gestation of pregnancy: Secondary | ICD-10-CM | POA: Diagnosis not present

## 2022-02-01 DIAGNOSIS — O99342 Other mental disorders complicating pregnancy, second trimester: Secondary | ICD-10-CM | POA: Diagnosis not present

## 2022-02-22 DIAGNOSIS — D649 Anemia, unspecified: Secondary | ICD-10-CM | POA: Diagnosis not present

## 2022-02-22 DIAGNOSIS — H9202 Otalgia, left ear: Secondary | ICD-10-CM | POA: Diagnosis not present

## 2022-02-22 DIAGNOSIS — O99212 Obesity complicating pregnancy, second trimester: Secondary | ICD-10-CM | POA: Diagnosis not present

## 2022-02-22 DIAGNOSIS — R519 Headache, unspecified: Secondary | ICD-10-CM | POA: Diagnosis not present

## 2022-02-22 DIAGNOSIS — O99012 Anemia complicating pregnancy, second trimester: Secondary | ICD-10-CM | POA: Diagnosis not present

## 2022-02-22 DIAGNOSIS — O26893 Other specified pregnancy related conditions, third trimester: Secondary | ICD-10-CM | POA: Diagnosis not present

## 2022-02-22 DIAGNOSIS — O99342 Other mental disorders complicating pregnancy, second trimester: Secondary | ICD-10-CM | POA: Diagnosis not present

## 2022-02-22 DIAGNOSIS — R030 Elevated blood-pressure reading, without diagnosis of hypertension: Secondary | ICD-10-CM | POA: Diagnosis not present

## 2022-02-22 DIAGNOSIS — O09522 Supervision of elderly multigravida, second trimester: Secondary | ICD-10-CM | POA: Diagnosis not present

## 2022-02-22 DIAGNOSIS — F419 Anxiety disorder, unspecified: Secondary | ICD-10-CM | POA: Diagnosis not present

## 2022-02-22 DIAGNOSIS — O210 Mild hyperemesis gravidarum: Secondary | ICD-10-CM | POA: Diagnosis not present

## 2022-02-25 DIAGNOSIS — O99810 Abnormal glucose complicating pregnancy: Secondary | ICD-10-CM | POA: Diagnosis not present

## 2022-02-27 ENCOUNTER — Encounter (HOSPITAL_COMMUNITY): Payer: Self-pay | Admitting: Obstetrics and Gynecology

## 2022-02-27 ENCOUNTER — Other Ambulatory Visit: Payer: Self-pay

## 2022-02-27 ENCOUNTER — Inpatient Hospital Stay (HOSPITAL_COMMUNITY)
Admission: AD | Admit: 2022-02-27 | Discharge: 2022-02-27 | Disposition: A | Discharge status: Home / Self Care | Payer: Medicaid Other | Attending: Obstetrics and Gynecology | Admitting: Obstetrics and Gynecology

## 2022-02-27 DIAGNOSIS — F445 Conversion disorder with seizures or convulsions: Secondary | ICD-10-CM | POA: Diagnosis not present

## 2022-02-27 DIAGNOSIS — O99342 Other mental disorders complicating pregnancy, second trimester: Secondary | ICD-10-CM | POA: Diagnosis not present

## 2022-02-27 DIAGNOSIS — F419 Anxiety disorder, unspecified: Secondary | ICD-10-CM | POA: Diagnosis not present

## 2022-02-27 DIAGNOSIS — Z3A27 27 weeks gestation of pregnancy: Secondary | ICD-10-CM | POA: Insufficient documentation

## 2022-02-27 HISTORY — DX: Unspecified convulsions: R56.9

## 2022-02-27 LAB — COMPREHENSIVE METABOLIC PANEL
ALT: 11 U/L (ref 0–44)
AST: 15 U/L (ref 15–41)
Albumin: 2.9 g/dL — ABNORMAL LOW (ref 3.5–5.0)
Alkaline Phosphatase: 82 U/L (ref 38–126)
Anion gap: 13 (ref 5–15)
BUN: 10 mg/dL (ref 6–20)
CO2: 21 mmol/L — ABNORMAL LOW (ref 22–32)
Calcium: 8.9 mg/dL (ref 8.9–10.3)
Chloride: 101 mmol/L (ref 98–111)
Creatinine, Ser: 0.66 mg/dL (ref 0.44–1.00)
GFR, Estimated: >60 mL/min (ref >60)
Glucose, Bld: 84 mg/dL (ref 70–99)
Potassium: 4.1 mmol/L (ref 3.5–5.1)
Sodium: 135 mmol/L (ref 135–145)
Total Bilirubin: 0.5 mg/dL (ref 0.3–1.2)
Total Protein: 6.3 g/dL — ABNORMAL LOW (ref 6.5–8.1)

## 2022-02-27 LAB — CBC WITH DIFFERENTIAL/PLATELET
Abs Immature Granulocytes: 0.48 10*3/uL — ABNORMAL HIGH (ref 0.00–0.07)
Basophils Absolute: 0.1 10*3/uL (ref 0.0–0.1)
Basophils Relative: 0 %
Eosinophils Absolute: 0.1 10*3/uL (ref 0.0–0.5)
Eosinophils Relative: 1 %
HCT: 35.3 % — ABNORMAL LOW (ref 36.0–46.0)
Hemoglobin: 11 g/dL — ABNORMAL LOW (ref 12.0–15.0)
Immature Granulocytes: 4 %
Lymphocytes Relative: 18 %
Lymphs Abs: 2.3 10*3/uL (ref 0.7–4.0)
MCH: 25.8 pg — ABNORMAL LOW (ref 26.0–34.0)
MCHC: 31.2 g/dL (ref 30.0–36.0)
MCV: 82.9 fL (ref 80.0–100.0)
Monocytes Absolute: 1.2 10*3/uL — ABNORMAL HIGH (ref 0.1–1.0)
Monocytes Relative: 10 %
Neutro Abs: 8.6 10*3/uL — ABNORMAL HIGH (ref 1.7–7.7)
Neutrophils Relative %: 67 %
Platelets: 298 10*3/uL (ref 150–400)
RBC: 4.26 MIL/uL (ref 3.87–5.11)
RDW: 18.9 % — ABNORMAL HIGH (ref 11.5–15.5)
WBC: 12.8 10*3/uL — ABNORMAL HIGH (ref 4.0–10.5)
nRBC: 0 % (ref 0.0–0.2)

## 2022-02-27 MED ORDER — HYDROXYZINE HCL 50 MG PO TABS
50.0000 mg | ORAL_TABLET | Freq: Once | ORAL | Status: AC
Start: 2022-02-27 — End: 2022-02-27
  Administered 2022-02-27: 50 mg via ORAL
  Filled 2022-02-27: qty 1

## 2022-02-27 MED ORDER — LORAZEPAM 2 MG/ML IJ SOLN
INTRAMUSCULAR | Status: AC
  Filled 2022-02-27: qty 1

## 2022-02-27 MED ORDER — LACTATED RINGERS IV BOLUS
1000.0000 mL | Freq: Once | INTRAVENOUS | Status: AC
  Administered 2022-02-27: 1000 mL via INTRAVENOUS

## 2022-02-27 NOTE — MAU Note (Incomplete)
Tonya Melton is a 41 y.o. at [redacted]w[redacted]d here in MAU reporting: been a lot going on. . Been having symptoms of pre- eclampsia: chest pain- but not since Wed- until on the way here- gone now. SOB, blurred vision, sick to her stomach and been sleeping since Friday, HA (taking Tylenol- last at 0800). Having lower abd pain, rt shoulder blade keep hurting.(Normally goes [facility], but this is closer). Very extreme anxiety. Been trying to calm herself down.  ?Denies bleeding, no ROM  unsure if baby has been moving due to sleep and extreme tiredness. ? ?The chest pain feels like a panic attack ?Onset of complaint: going on past couple days ?Pain score: 3-6 ?Vitals:  ? 02/27/22 1658 02/27/22 1703  ?BP:  124/76  ?Pulse:  88  ?Resp:  18  ?Temp:  98.7 ?F (37.1 ?C)  ?SpO2: 100% 100%  ?   ?FHT:140 ?Lab orders placed from triage:  urine ?

## 2022-02-27 NOTE — MAU Note (Signed)
RN informed of patient having a seizure by [place], [place] at approximately [phone]. Lareta Bruneau, RN to bedside and patient turned to right side. Dorathy Kinsman, CNM, Cleone Slim, CNM, and [place], CNM at bedside at [phone]. Seizure activity ceased at 19:25:40. Suction equipment, oxygen, and seizure pads in place. Obtaining IV access and continuing with current management at this time.  ?

## 2022-02-27 NOTE — MAU Provider Note (Signed)
Chief Complaint:  Shortness of Breath, Abdominal Pain, anxious, shoulder blade pain, Blurred Vision, Nausea, and Fatigue ? ? Event Date/Time  ? First Provider Initiated Contact with Patient 02/27/22 1714   ?  ?HPI: Tonya Melton is a 41 y.o. G1P0 at 6057w6d who presents to maternity admissions reporting: ?Symptoms that she is concerned could be Pre-E--blurry vision, SOB, upper abd pain that resolved spontaneously 4/26. ?Feeling nauseous and tired since doing a 3 hour GTT on Friday 4/28. Slept a lot since then. Hasn barely had anythign to eat or drink to to sleeping and not feeling well. ?HA that resolved w/ Tylenol today. ?Extreme anxiety. Seen a [facility] provider for this. Takes Prozac.  ? ?Associated signs and symptoms: Neg for fever, chills, vomiting. Not currently reporting nausea, HA, SOB, vision changes, upper abd pain.  ? ?Denies contractions, leakage of fluid or vaginal bleeding. Good fetal movement.  ? ?Gets care at [facility].  ? ?Past Medical History:  ?Diagnosis Date  ? Anxiety   ? Asthma   ? ?OB History  ?Gravida Para Term Preterm AB Living  ?1            ?SAB IAB Ectopic Multiple Live Births  ?           ?  ?# Outcome Date GA Lbr Len/2nd Weight Sex Delivery Anes PTL Lv  ?1 Current           ? ?Past Surgical History:  ?Procedure Laterality Date  ? deviated septum  2019  ? ?No family history on file. ?Social History  ? ?Tobacco Use  ? Smoking status: Never  ? Smokeless tobacco: Never  ?Vaping Use  ? Vaping Use: Never used  ?Substance Use Topics  ? Alcohol use: Yes  ? Drug use: Never  ? ?Allergies  ?Allergen Reactions  ? Amoxicillin   ? Biaxin [Clarithromycin]   ? Doxycycline   ? Penicillins   ? ?Medications Prior to Admission  ?Medication Sig Dispense Refill Last Dose  ? albuterol (PROVENTIL) (5 MG/ML) 0.5% nebulizer solution Take 0.5 mLs (2.5 mg total) by nebulization every 6 (six) hours as needed for wheezing or shortness of breath. 20 mL 12   ? metoCLOPramide (REGLAN) 10 MG tablet Take 1 tablet (10 mg total) by  mouth every 6 (six) hours as needed for nausea. 30 tablet 0   ? ondansetron (ZOFRAN ODT) 4 MG disintegrating tablet Take 1 tablet (4 mg total) by mouth every 8 (eight) hours as needed for nausea or vomiting. 20 tablet 0   ? promethazine (PHENERGAN) 25 MG tablet Take 1 tablet (25 mg total) by mouth at bedtime as needed for nausea or vomiting. 30 tablet 2   ? ? ?I have reviewed patient's Past Medical Hx, Surgical Hx, Family Hx, Social Hx, medications and allergies.  ? ?ROS:  ?Review of Systems  ?Constitutional:  Negative for chills and fever.  ?Eyes:  Positive for visual disturbance (none now).  ?Respiratory:  Positive for shortness of breath (none now). Negative for cough, chest tightness and wheezing.   ?Cardiovascular:  Negative for chest pain.  ?Gastrointestinal:  Positive for abdominal pain and nausea. Negative for vomiting.  ?Genitourinary:  Negative for vaginal bleeding.  ?Musculoskeletal:   ?     Right shoulder blade pain  ?Neurological:  Positive for headaches (none now).  ?Psychiatric/Behavioral:  The patient is nervous/anxious.   ? ?Physical Exam  ?Patient Vitals for the past 24 hrs: ? BP Temp Temp src Pulse Resp SpO2  ?02/27/22 1703 124/76 98.7 ?  F (37.1 ?C) Oral 88 18 100 %  ?02/27/22 1658 -- -- -- -- -- 100 %  ? ? ? ? ?Constitutional: Well-developed, well-nourished female in mild distress. Tired-appearing. Non-toxic-appearing.  ?Cardiovascular: normal rate and rhythm. No M/R/G ?Respiratory: normal effort Initially mildly tachypnic but resolved. No wheezing or stridor. CTAB.  ?GI: Abd soft, non-tender, gravid appropriate for gestational age.  ?MS: Extremities nontender, no edema, normal ROM ?Neurologic: Alert and oriented x 4.  ?GU: Deferred ? ?FHT:  Baseline 150 , moderate variability, no accelerations present, decel to 120's x 2 minutes early in tracing and few mild variables decelerations. Reassuring and appropriate for gestational age on extended monitoring.  ?Contractions: None ?  ?Labs: ? ?Results  for orders placed or performed during the hospital encounter of 02/27/22 (from the past 72 hour(s))  ?CBC with Differential/Platelet     Status: Abnormal  ? Collection Time: 02/27/22  5:42 PM  ?Result Value Ref Range  ? WBC 12.8 (H) 4.0 - 10.5 K/uL  ? RBC 4.26 3.87 - 5.11 MIL/uL  ? Hemoglobin 11.0 (L) 12.0 - 15.0 g/dL  ? HCT 35.3 (L) 36.0 - 46.0 %  ? MCV 82.9 80.0 - 100.0 fL  ? MCH 25.8 (L) 26.0 - 34.0 pg  ? MCHC 31.2 30.0 - 36.0 g/dL  ? RDW 18.9 (H) 11.5 - 15.5 %  ? Platelets 298 150 - 400 K/uL  ? nRBC 0.0 0.0 - 0.2 %  ? Neutrophils Relative % 67 %  ? Neutro Abs 8.6 (H) 1.7 - 7.7 K/uL  ? Lymphocytes Relative 18 %  ? Lymphs Abs 2.3 0.7 - 4.0 K/uL  ? Monocytes Relative 10 %  ? Monocytes Absolute 1.2 (H) 0.1 - 1.0 K/uL  ? Eosinophils Relative 1 %  ? Eosinophils Absolute 0.1 0.0 - 0.5 K/uL  ? Basophils Relative 0 %  ? Basophils Absolute 0.1 0.0 - 0.1 K/uL  ? Immature Granulocytes 4 %  ? Abs Immature Granulocytes 0.48 (H) 0.00 - 0.07 K/uL  ?  Comment: Performed at [facility], [address], [place], [place] [address]  ?Comprehensive metabolic panel     Status: Abnormal  ? Collection Time: 02/27/22  5:42 PM  ?Result Value Ref Range  ? Sodium 135 135 - 145 mmol/L  ? Potassium 4.1 3.5 - 5.1 mmol/L  ? Chloride 101 98 - 111 mmol/L  ? CO2 21 (L) 22 - 32 mmol/L  ? Glucose, Bld 84 70 - 99 mg/dL  ?  Comment: Glucose reference range applies only to samples taken after fasting for at least 8 hours.  ? BUN 10 6 - 20 mg/dL  ? Creatinine, Ser 0.66 0.44 - 1.00 mg/dL  ? Calcium 8.9 8.9 - 10.3 mg/dL  ? Total Protein 6.3 (L) 6.5 - 8.1 g/dL  ? Albumin 2.9 (L) 3.5 - 5.0 g/dL  ? AST 15 15 - 41 U/L  ? ALT 11 0 - 44 U/L  ? Alkaline Phosphatase 82 38 - 126 U/L  ? Total Bilirubin 0.5 0.3 - 1.2 mg/dL  ? GFR, Estimated >60 >60 mL/min  ?  Comment: (NOTE) ?Calculated using the CKD-EPI Creatinine Equation (2021) ?  ? Anion gap 13 5 - 15  ?  Comment: Performed at [facility], [address], [place], [place] [address]  ?  ? ?EKG  ?Nml   ? ?MAU Course: ?Orders Placed This Encounter  ?Procedures  ? CBC with Differential/Platelet  ? Comprehensive metabolic panel  ? EKG 12-Lead  ? ?  Meds ordered this encounter  ?Medications  ? hydrOXYzine (ATARAX) tablet 50 mg  ? ?MDM: ?25: RN called out that pt was having a seizure. Upon arrival to room pt appeared to have stiffening of her whole body, eyes closed. Nml resp. No biting of tongue or incontinence. Asked Husband at [facility] if she had Hx seizures. 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 Pt started speaking and described her work-up. States they are stress-induced, that she had nml EEG and imaging. Took Benzos before pregnancy and they helped. Husband thinks she has one other episode in the past year. Whole episode lasted 2 minutes. Pt appeared to have return to baseline immediately after. Pt given fluid bolus, vistaril. Feeling much better. Discussed seizure-like activity, Hx, labs, exam w/ Dr. Despina Hidden. Agrees w/ POC. New orders: none. Very low suspicion for Eclampsia due to pt Hx of same episodes, pt being normotensive except for diastolic of 93 during th episode, Nml pre-E bloodwork (urine not collected in oversite) and absence of current HA, vision changes or epigastric pain.  ? ?After observing pt and discussing labs, all that has been going on over the last several days she feels like this is mostly her anxiety. CNM also discussed that she could have been hypoglycemic at some point and dehydrated from poor PO intake over three days contributing to several of the Sx. Pt says she feel much betting. Sx have resolved. Feels comfortable ambulating w/ RN.  ? ?No difficulty ambulating. Feels ready to go home. Husband will drive. Discussed importance of small, frequent meals and drinks even when appetite is low. Recommend F/U w/ who ever manages her seizure-like episodes. She says her [facility] provider does and she has an appointment in 2 days.  ? ? ?Assessment: ?1.  Anxiety during pregnancy in second trimester, antepartum   ?2. Psychiatric pseudoseizure   ?3. [redacted] weeks gestation of pregnancy   ? ? ?Plan: ?Discharge home in stable condition.  ?Preterm Labor precautions

## 2022-03-01 DIAGNOSIS — Z79899 Other long term (current) drug therapy: Secondary | ICD-10-CM | POA: Diagnosis not present

## 2022-03-01 DIAGNOSIS — F4321 Adjustment disorder with depressed mood: Secondary | ICD-10-CM | POA: Diagnosis not present

## 2022-03-01 DIAGNOSIS — O99343 Other mental disorders complicating pregnancy, third trimester: Secondary | ICD-10-CM | POA: Diagnosis not present

## 2022-03-01 DIAGNOSIS — Z3A28 28 weeks gestation of pregnancy: Secondary | ICD-10-CM | POA: Diagnosis not present

## 2022-03-01 DIAGNOSIS — F411 Generalized anxiety disorder: Secondary | ICD-10-CM | POA: Diagnosis not present

## 2022-03-04 DIAGNOSIS — O365930 Maternal care for other known or suspected poor fetal growth, third trimester, not applicable or unspecified: Secondary | ICD-10-CM | POA: Diagnosis not present

## 2022-03-04 DIAGNOSIS — O358XX0 Maternal care for other (suspected) fetal abnormality and damage, not applicable or unspecified: Secondary | ICD-10-CM | POA: Diagnosis not present

## 2022-03-04 DIAGNOSIS — O99213 Obesity complicating pregnancy, third trimester: Secondary | ICD-10-CM | POA: Diagnosis not present

## 2022-03-04 DIAGNOSIS — O09523 Supervision of elderly multigravida, third trimester: Secondary | ICD-10-CM | POA: Diagnosis not present

## 2022-03-04 DIAGNOSIS — Z3689 Encounter for other specified antenatal screening: Secondary | ICD-10-CM | POA: Diagnosis not present

## 2022-03-04 DIAGNOSIS — Z3A28 28 weeks gestation of pregnancy: Secondary | ICD-10-CM | POA: Diagnosis not present

## 2022-03-04 DIAGNOSIS — O283 Abnormal ultrasonic finding on antenatal screening of mother: Secondary | ICD-10-CM | POA: Diagnosis not present

## 2022-03-04 DIAGNOSIS — E669 Obesity, unspecified: Secondary | ICD-10-CM | POA: Diagnosis not present

## 2022-03-04 DIAGNOSIS — O321XX0 Maternal care for breech presentation, not applicable or unspecified: Secondary | ICD-10-CM | POA: Diagnosis not present

## 2022-03-04 DIAGNOSIS — Z364 Encounter for antenatal screening for fetal growth retardation: Secondary | ICD-10-CM | POA: Diagnosis not present

## 2022-03-08 DIAGNOSIS — Z79899 Other long term (current) drug therapy: Secondary | ICD-10-CM | POA: Diagnosis not present

## 2022-03-08 DIAGNOSIS — Z3A29 29 weeks gestation of pregnancy: Secondary | ICD-10-CM | POA: Diagnosis not present

## 2022-03-08 DIAGNOSIS — F419 Anxiety disorder, unspecified: Secondary | ICD-10-CM | POA: Diagnosis not present

## 2022-03-08 DIAGNOSIS — O24410 Gestational diabetes mellitus in pregnancy, diet controlled: Secondary | ICD-10-CM | POA: Diagnosis not present

## 2022-03-08 DIAGNOSIS — O26893 Other specified pregnancy related conditions, third trimester: Secondary | ICD-10-CM | POA: Diagnosis not present

## 2022-03-08 DIAGNOSIS — O09513 Supervision of elderly primigravida, third trimester: Secondary | ICD-10-CM | POA: Diagnosis not present

## 2022-03-08 DIAGNOSIS — O99343 Other mental disorders complicating pregnancy, third trimester: Secondary | ICD-10-CM | POA: Diagnosis not present

## 2022-03-08 DIAGNOSIS — O365930 Maternal care for other known or suspected poor fetal growth, third trimester, not applicable or unspecified: Secondary | ICD-10-CM | POA: Diagnosis not present

## 2022-03-08 DIAGNOSIS — R45851 Suicidal ideations: Secondary | ICD-10-CM | POA: Diagnosis not present

## 2022-03-11 DIAGNOSIS — Z3A29 29 weeks gestation of pregnancy: Secondary | ICD-10-CM | POA: Diagnosis not present

## 2022-03-11 DIAGNOSIS — O99213 Obesity complicating pregnancy, third trimester: Secondary | ICD-10-CM | POA: Diagnosis not present

## 2022-03-11 DIAGNOSIS — O365939 Maternal care for other known or suspected poor fetal growth, third trimester, other fetus: Secondary | ICD-10-CM | POA: Diagnosis not present

## 2022-03-11 DIAGNOSIS — O365930 Maternal care for other known or suspected poor fetal growth, third trimester, not applicable or unspecified: Secondary | ICD-10-CM | POA: Diagnosis not present

## 2022-03-11 DIAGNOSIS — O358XX0 Maternal care for other (suspected) fetal abnormality and damage, not applicable or unspecified: Secondary | ICD-10-CM | POA: Diagnosis not present

## 2022-03-11 DIAGNOSIS — O283 Abnormal ultrasonic finding on antenatal screening of mother: Secondary | ICD-10-CM | POA: Diagnosis not present

## 2022-03-11 DIAGNOSIS — Z3689 Encounter for other specified antenatal screening: Secondary | ICD-10-CM | POA: Diagnosis not present

## 2022-03-11 DIAGNOSIS — O09523 Supervision of elderly multigravida, third trimester: Secondary | ICD-10-CM | POA: Diagnosis not present

## 2022-03-11 DIAGNOSIS — E669 Obesity, unspecified: Secondary | ICD-10-CM | POA: Diagnosis not present

## 2022-03-14 DIAGNOSIS — O99213 Obesity complicating pregnancy, third trimester: Secondary | ICD-10-CM | POA: Diagnosis not present

## 2022-03-14 DIAGNOSIS — O09523 Supervision of elderly multigravida, third trimester: Secondary | ICD-10-CM | POA: Diagnosis not present

## 2022-03-14 DIAGNOSIS — O365939 Maternal care for other known or suspected poor fetal growth, third trimester, other fetus: Secondary | ICD-10-CM | POA: Diagnosis not present

## 2022-03-14 DIAGNOSIS — O283 Abnormal ultrasonic finding on antenatal screening of mother: Secondary | ICD-10-CM | POA: Diagnosis not present

## 2022-03-14 DIAGNOSIS — Z3689 Encounter for other specified antenatal screening: Secondary | ICD-10-CM | POA: Diagnosis not present

## 2022-03-14 DIAGNOSIS — O365930 Maternal care for other known or suspected poor fetal growth, third trimester, not applicable or unspecified: Secondary | ICD-10-CM | POA: Diagnosis not present

## 2022-03-14 DIAGNOSIS — Z3A30 30 weeks gestation of pregnancy: Secondary | ICD-10-CM | POA: Diagnosis not present

## 2022-03-14 DIAGNOSIS — E669 Obesity, unspecified: Secondary | ICD-10-CM | POA: Diagnosis not present

## 2022-03-22 DIAGNOSIS — Z3A31 31 weeks gestation of pregnancy: Secondary | ICD-10-CM | POA: Diagnosis not present

## 2022-03-22 DIAGNOSIS — F4321 Adjustment disorder with depressed mood: Secondary | ICD-10-CM | POA: Diagnosis not present

## 2022-03-22 DIAGNOSIS — O358XX0 Maternal care for other (suspected) fetal abnormality and damage, not applicable or unspecified: Secondary | ICD-10-CM | POA: Diagnosis not present

## 2022-03-22 DIAGNOSIS — O99013 Anemia complicating pregnancy, third trimester: Secondary | ICD-10-CM | POA: Diagnosis not present

## 2022-03-22 DIAGNOSIS — E669 Obesity, unspecified: Secondary | ICD-10-CM | POA: Diagnosis not present

## 2022-03-22 DIAGNOSIS — Z23 Encounter for immunization: Secondary | ICD-10-CM | POA: Diagnosis not present

## 2022-03-22 DIAGNOSIS — Z79899 Other long term (current) drug therapy: Secondary | ICD-10-CM | POA: Diagnosis not present

## 2022-03-22 DIAGNOSIS — O99213 Obesity complicating pregnancy, third trimester: Secondary | ICD-10-CM | POA: Diagnosis not present

## 2022-03-22 DIAGNOSIS — F411 Generalized anxiety disorder: Secondary | ICD-10-CM | POA: Diagnosis not present

## 2022-03-22 DIAGNOSIS — G43019 Migraine without aura, intractable, without status migrainosus: Secondary | ICD-10-CM | POA: Diagnosis not present

## 2022-03-22 DIAGNOSIS — O09523 Supervision of elderly multigravida, third trimester: Secondary | ICD-10-CM | POA: Diagnosis not present

## 2022-03-22 DIAGNOSIS — O365930 Maternal care for other known or suspected poor fetal growth, third trimester, not applicable or unspecified: Secondary | ICD-10-CM | POA: Diagnosis not present

## 2022-03-22 DIAGNOSIS — R030 Elevated blood-pressure reading, without diagnosis of hypertension: Secondary | ICD-10-CM | POA: Diagnosis not present

## 2022-03-22 DIAGNOSIS — O43193 Other malformation of placenta, third trimester: Secondary | ICD-10-CM | POA: Diagnosis not present

## 2022-03-22 DIAGNOSIS — O24410 Gestational diabetes mellitus in pregnancy, diet controlled: Secondary | ICD-10-CM | POA: Diagnosis not present

## 2022-03-22 DIAGNOSIS — D649 Anemia, unspecified: Secondary | ICD-10-CM | POA: Diagnosis not present

## 2022-03-22 DIAGNOSIS — O99353 Diseases of the nervous system complicating pregnancy, third trimester: Secondary | ICD-10-CM | POA: Diagnosis not present

## 2022-03-22 DIAGNOSIS — Z3689 Encounter for other specified antenatal screening: Secondary | ICD-10-CM | POA: Diagnosis not present

## 2022-03-22 DIAGNOSIS — O26893 Other specified pregnancy related conditions, third trimester: Secondary | ICD-10-CM | POA: Diagnosis not present

## 2022-03-22 DIAGNOSIS — O09513 Supervision of elderly primigravida, third trimester: Secondary | ICD-10-CM | POA: Diagnosis not present

## 2022-03-22 DIAGNOSIS — O99891 Other specified diseases and conditions complicating pregnancy: Secondary | ICD-10-CM | POA: Diagnosis not present

## 2022-03-22 DIAGNOSIS — R519 Headache, unspecified: Secondary | ICD-10-CM | POA: Diagnosis not present

## 2022-03-22 DIAGNOSIS — O99343 Other mental disorders complicating pregnancy, third trimester: Secondary | ICD-10-CM | POA: Diagnosis not present

## 2022-03-23 DIAGNOSIS — O24410 Gestational diabetes mellitus in pregnancy, diet controlled: Secondary | ICD-10-CM | POA: Diagnosis not present

## 2022-03-23 DIAGNOSIS — Z3A31 31 weeks gestation of pregnancy: Secondary | ICD-10-CM | POA: Diagnosis not present

## 2022-03-25 DIAGNOSIS — Z3A31 31 weeks gestation of pregnancy: Secondary | ICD-10-CM | POA: Diagnosis not present

## 2022-03-25 DIAGNOSIS — O365930 Maternal care for other known or suspected poor fetal growth, third trimester, not applicable or unspecified: Secondary | ICD-10-CM | POA: Diagnosis not present

## 2022-03-25 DIAGNOSIS — O0993 Supervision of high risk pregnancy, unspecified, third trimester: Secondary | ICD-10-CM | POA: Diagnosis not present

## 2022-03-25 DIAGNOSIS — O09523 Supervision of elderly multigravida, third trimester: Secondary | ICD-10-CM | POA: Diagnosis not present

## 2022-03-29 DIAGNOSIS — Z3A32 32 weeks gestation of pregnancy: Secondary | ICD-10-CM | POA: Diagnosis not present

## 2022-03-29 DIAGNOSIS — D649 Anemia, unspecified: Secondary | ICD-10-CM | POA: Diagnosis not present

## 2022-03-29 DIAGNOSIS — O99891 Other specified diseases and conditions complicating pregnancy: Secondary | ICD-10-CM | POA: Diagnosis not present

## 2022-03-29 DIAGNOSIS — O368130 Decreased fetal movements, third trimester, not applicable or unspecified: Secondary | ICD-10-CM | POA: Diagnosis not present

## 2022-03-29 DIAGNOSIS — R519 Headache, unspecified: Secondary | ICD-10-CM | POA: Diagnosis not present

## 2022-03-29 DIAGNOSIS — O09523 Supervision of elderly multigravida, third trimester: Secondary | ICD-10-CM | POA: Diagnosis not present

## 2022-03-29 DIAGNOSIS — O09529 Supervision of elderly multigravida, unspecified trimester: Secondary | ICD-10-CM | POA: Diagnosis not present

## 2022-03-29 DIAGNOSIS — O09513 Supervision of elderly primigravida, third trimester: Secondary | ICD-10-CM | POA: Diagnosis not present

## 2022-03-29 DIAGNOSIS — O24410 Gestational diabetes mellitus in pregnancy, diet controlled: Secondary | ICD-10-CM | POA: Diagnosis not present

## 2022-03-29 DIAGNOSIS — O26893 Other specified pregnancy related conditions, third trimester: Secondary | ICD-10-CM | POA: Diagnosis not present

## 2022-03-29 DIAGNOSIS — O99013 Anemia complicating pregnancy, third trimester: Secondary | ICD-10-CM | POA: Diagnosis not present

## 2022-03-30 DIAGNOSIS — Z3A32 32 weeks gestation of pregnancy: Secondary | ICD-10-CM | POA: Diagnosis not present

## 2022-03-30 DIAGNOSIS — N898 Other specified noninflammatory disorders of vagina: Secondary | ICD-10-CM | POA: Diagnosis not present

## 2022-03-30 DIAGNOSIS — Z3689 Encounter for other specified antenatal screening: Secondary | ICD-10-CM | POA: Diagnosis not present

## 2022-03-30 DIAGNOSIS — O99013 Anemia complicating pregnancy, third trimester: Secondary | ICD-10-CM | POA: Diagnosis not present

## 2022-03-30 DIAGNOSIS — Z8669 Personal history of other diseases of the nervous system and sense organs: Secondary | ICD-10-CM | POA: Diagnosis not present

## 2022-03-30 DIAGNOSIS — O99891 Other specified diseases and conditions complicating pregnancy: Secondary | ICD-10-CM | POA: Diagnosis not present

## 2022-03-30 DIAGNOSIS — O26893 Other specified pregnancy related conditions, third trimester: Secondary | ICD-10-CM | POA: Diagnosis not present

## 2022-03-30 DIAGNOSIS — R519 Headache, unspecified: Secondary | ICD-10-CM | POA: Diagnosis not present

## 2022-03-30 DIAGNOSIS — O09513 Supervision of elderly primigravida, third trimester: Secondary | ICD-10-CM | POA: Diagnosis not present

## 2022-03-30 DIAGNOSIS — D649 Anemia, unspecified: Secondary | ICD-10-CM | POA: Diagnosis not present

## 2022-03-30 DIAGNOSIS — O4703 False labor before 37 completed weeks of gestation, third trimester: Secondary | ICD-10-CM | POA: Diagnosis not present

## 2022-04-01 DIAGNOSIS — O09523 Supervision of elderly multigravida, third trimester: Secondary | ICD-10-CM | POA: Diagnosis not present

## 2022-04-01 DIAGNOSIS — O365930 Maternal care for other known or suspected poor fetal growth, third trimester, not applicable or unspecified: Secondary | ICD-10-CM | POA: Diagnosis not present

## 2022-04-01 DIAGNOSIS — O99213 Obesity complicating pregnancy, third trimester: Secondary | ICD-10-CM | POA: Diagnosis not present

## 2022-04-01 DIAGNOSIS — O26893 Other specified pregnancy related conditions, third trimester: Secondary | ICD-10-CM | POA: Diagnosis not present

## 2022-04-01 DIAGNOSIS — R030 Elevated blood-pressure reading, without diagnosis of hypertension: Secondary | ICD-10-CM | POA: Diagnosis not present

## 2022-04-01 DIAGNOSIS — O283 Abnormal ultrasonic finding on antenatal screening of mother: Secondary | ICD-10-CM | POA: Diagnosis not present

## 2022-04-01 DIAGNOSIS — Z3A32 32 weeks gestation of pregnancy: Secondary | ICD-10-CM | POA: Diagnosis not present

## 2022-04-01 DIAGNOSIS — O365939 Maternal care for other known or suspected poor fetal growth, third trimester, other fetus: Secondary | ICD-10-CM | POA: Diagnosis not present

## 2022-04-01 DIAGNOSIS — O163 Unspecified maternal hypertension, third trimester: Secondary | ICD-10-CM | POA: Diagnosis not present

## 2022-04-01 DIAGNOSIS — Z3689 Encounter for other specified antenatal screening: Secondary | ICD-10-CM | POA: Diagnosis not present

## 2022-04-04 DIAGNOSIS — Z3A33 33 weeks gestation of pregnancy: Secondary | ICD-10-CM | POA: Diagnosis not present

## 2022-04-04 DIAGNOSIS — O365930 Maternal care for other known or suspected poor fetal growth, third trimester, not applicable or unspecified: Secondary | ICD-10-CM | POA: Diagnosis not present

## 2022-04-04 DIAGNOSIS — O09513 Supervision of elderly primigravida, third trimester: Secondary | ICD-10-CM | POA: Diagnosis not present

## 2022-04-04 DIAGNOSIS — O43193 Other malformation of placenta, third trimester: Secondary | ICD-10-CM | POA: Diagnosis not present

## 2022-04-07 DIAGNOSIS — Z3A33 33 weeks gestation of pregnancy: Secondary | ICD-10-CM | POA: Diagnosis not present

## 2022-04-07 DIAGNOSIS — Z713 Dietary counseling and surveillance: Secondary | ICD-10-CM | POA: Diagnosis not present

## 2022-04-07 DIAGNOSIS — O24410 Gestational diabetes mellitus in pregnancy, diet controlled: Secondary | ICD-10-CM | POA: Diagnosis not present

## 2022-04-11 DIAGNOSIS — O358XX0 Maternal care for other (suspected) fetal abnormality and damage, not applicable or unspecified: Secondary | ICD-10-CM | POA: Diagnosis not present

## 2022-04-11 DIAGNOSIS — Z3A34 34 weeks gestation of pregnancy: Secondary | ICD-10-CM | POA: Diagnosis not present

## 2022-04-11 DIAGNOSIS — E669 Obesity, unspecified: Secondary | ICD-10-CM | POA: Diagnosis not present

## 2022-04-11 DIAGNOSIS — O24410 Gestational diabetes mellitus in pregnancy, diet controlled: Secondary | ICD-10-CM | POA: Diagnosis not present

## 2022-04-11 DIAGNOSIS — O283 Abnormal ultrasonic finding on antenatal screening of mother: Secondary | ICD-10-CM | POA: Diagnosis not present

## 2022-04-11 DIAGNOSIS — Z3689 Encounter for other specified antenatal screening: Secondary | ICD-10-CM | POA: Diagnosis not present

## 2022-04-11 DIAGNOSIS — O09513 Supervision of elderly primigravida, third trimester: Secondary | ICD-10-CM | POA: Diagnosis not present

## 2022-04-11 DIAGNOSIS — O99213 Obesity complicating pregnancy, third trimester: Secondary | ICD-10-CM | POA: Diagnosis not present

## 2022-04-11 DIAGNOSIS — O365930 Maternal care for other known or suspected poor fetal growth, third trimester, not applicable or unspecified: Secondary | ICD-10-CM | POA: Diagnosis not present

## 2022-04-11 DIAGNOSIS — O09523 Supervision of elderly multigravida, third trimester: Secondary | ICD-10-CM | POA: Diagnosis not present

## 2022-04-12 DIAGNOSIS — O99343 Other mental disorders complicating pregnancy, third trimester: Secondary | ICD-10-CM | POA: Diagnosis not present

## 2022-04-12 DIAGNOSIS — O133 Gestational [pregnancy-induced] hypertension without significant proteinuria, third trimester: Secondary | ICD-10-CM | POA: Diagnosis not present

## 2022-04-12 DIAGNOSIS — F4321 Adjustment disorder with depressed mood: Secondary | ICD-10-CM | POA: Diagnosis not present

## 2022-04-12 DIAGNOSIS — Z79899 Other long term (current) drug therapy: Secondary | ICD-10-CM | POA: Diagnosis not present

## 2022-04-12 DIAGNOSIS — R519 Headache, unspecified: Secondary | ICD-10-CM | POA: Diagnosis not present

## 2022-04-12 DIAGNOSIS — O09523 Supervision of elderly multigravida, third trimester: Secondary | ICD-10-CM | POA: Diagnosis not present

## 2022-04-12 DIAGNOSIS — F419 Anxiety disorder, unspecified: Secondary | ICD-10-CM | POA: Diagnosis not present

## 2022-04-12 DIAGNOSIS — O26893 Other specified pregnancy related conditions, third trimester: Secondary | ICD-10-CM | POA: Diagnosis not present

## 2022-04-12 DIAGNOSIS — Z3A34 34 weeks gestation of pregnancy: Secondary | ICD-10-CM | POA: Diagnosis not present

## 2022-04-12 DIAGNOSIS — O09513 Supervision of elderly primigravida, third trimester: Secondary | ICD-10-CM | POA: Diagnosis not present

## 2022-04-12 DIAGNOSIS — F411 Generalized anxiety disorder: Secondary | ICD-10-CM | POA: Diagnosis not present

## 2022-04-13 DIAGNOSIS — F419 Anxiety disorder, unspecified: Secondary | ICD-10-CM | POA: Diagnosis not present

## 2022-04-13 DIAGNOSIS — O99891 Other specified diseases and conditions complicating pregnancy: Secondary | ICD-10-CM | POA: Diagnosis not present

## 2022-04-13 DIAGNOSIS — O24410 Gestational diabetes mellitus in pregnancy, diet controlled: Secondary | ICD-10-CM | POA: Diagnosis not present

## 2022-04-13 DIAGNOSIS — O99343 Other mental disorders complicating pregnancy, third trimester: Secondary | ICD-10-CM | POA: Diagnosis not present

## 2022-04-13 DIAGNOSIS — O133 Gestational [pregnancy-induced] hypertension without significant proteinuria, third trimester: Secondary | ICD-10-CM | POA: Diagnosis not present

## 2022-04-13 DIAGNOSIS — J45909 Unspecified asthma, uncomplicated: Secondary | ICD-10-CM | POA: Diagnosis not present

## 2022-04-13 DIAGNOSIS — O210 Mild hyperemesis gravidarum: Secondary | ICD-10-CM | POA: Diagnosis not present

## 2022-04-13 DIAGNOSIS — Z3A34 34 weeks gestation of pregnancy: Secondary | ICD-10-CM | POA: Diagnosis not present

## 2022-04-13 DIAGNOSIS — O99513 Diseases of the respiratory system complicating pregnancy, third trimester: Secondary | ICD-10-CM | POA: Diagnosis not present

## 2022-04-13 DIAGNOSIS — Z79899 Other long term (current) drug therapy: Secondary | ICD-10-CM | POA: Diagnosis not present

## 2022-04-13 DIAGNOSIS — O09523 Supervision of elderly multigravida, third trimester: Secondary | ICD-10-CM | POA: Diagnosis not present

## 2022-04-13 DIAGNOSIS — O365930 Maternal care for other known or suspected poor fetal growth, third trimester, not applicable or unspecified: Secondary | ICD-10-CM | POA: Diagnosis not present

## 2022-04-13 DIAGNOSIS — G43019 Migraine without aura, intractable, without status migrainosus: Secondary | ICD-10-CM | POA: Diagnosis not present

## 2022-04-13 DIAGNOSIS — O9913 Other diseases of the blood and blood-forming organs and certain disorders involving the immune mechanism complicating the puerperium: Secondary | ICD-10-CM | POA: Diagnosis not present

## 2022-04-13 DIAGNOSIS — Q270 Congenital absence and hypoplasia of umbilical artery: Secondary | ICD-10-CM | POA: Diagnosis not present

## 2022-04-13 DIAGNOSIS — O99213 Obesity complicating pregnancy, third trimester: Secondary | ICD-10-CM | POA: Diagnosis not present

## 2022-04-13 DIAGNOSIS — O368930 Maternal care for other specified fetal problems, third trimester, not applicable or unspecified: Secondary | ICD-10-CM | POA: Diagnosis not present

## 2022-04-13 DIAGNOSIS — O99353 Diseases of the nervous system complicating pregnancy, third trimester: Secondary | ICD-10-CM | POA: Diagnosis not present

## 2022-04-18 DIAGNOSIS — F410 Panic disorder [episodic paroxysmal anxiety] without agoraphobia: Secondary | ICD-10-CM | POA: Diagnosis not present

## 2022-04-18 DIAGNOSIS — Z880 Allergy status to penicillin: Secondary | ICD-10-CM | POA: Diagnosis not present

## 2022-04-18 DIAGNOSIS — O1413 Severe pre-eclampsia, third trimester: Secondary | ICD-10-CM | POA: Diagnosis not present

## 2022-04-18 DIAGNOSIS — O99344 Other mental disorders complicating childbirth: Secondary | ICD-10-CM | POA: Diagnosis not present

## 2022-04-18 DIAGNOSIS — O24420 Gestational diabetes mellitus in childbirth, diet controlled: Secondary | ICD-10-CM | POA: Diagnosis not present

## 2022-04-18 DIAGNOSIS — O1414 Severe pre-eclampsia complicating childbirth: Secondary | ICD-10-CM | POA: Diagnosis not present

## 2022-04-18 DIAGNOSIS — J45909 Unspecified asthma, uncomplicated: Secondary | ICD-10-CM | POA: Diagnosis not present

## 2022-04-18 DIAGNOSIS — Z3A35 35 weeks gestation of pregnancy: Secondary | ICD-10-CM | POA: Diagnosis not present

## 2022-04-18 DIAGNOSIS — O9952 Diseases of the respiratory system complicating childbirth: Secondary | ICD-10-CM | POA: Diagnosis not present

## 2022-04-18 DIAGNOSIS — F32A Depression, unspecified: Secondary | ICD-10-CM | POA: Diagnosis not present

## 2022-04-18 DIAGNOSIS — O365930 Maternal care for other known or suspected poor fetal growth, third trimester, not applicable or unspecified: Secondary | ICD-10-CM | POA: Diagnosis not present

## 2022-04-18 DIAGNOSIS — O99214 Obesity complicating childbirth: Secondary | ICD-10-CM | POA: Diagnosis not present

## 2022-04-18 DIAGNOSIS — F419 Anxiety disorder, unspecified: Secondary | ICD-10-CM | POA: Diagnosis not present

## 2022-04-18 DIAGNOSIS — O9902 Anemia complicating childbirth: Secondary | ICD-10-CM | POA: Diagnosis not present

## 2022-04-18 DIAGNOSIS — O99354 Diseases of the nervous system complicating childbirth: Secondary | ICD-10-CM | POA: Diagnosis not present

## 2022-04-19 DIAGNOSIS — O24419 Gestational diabetes mellitus in pregnancy, unspecified control: Secondary | ICD-10-CM | POA: Diagnosis not present

## 2022-04-19 DIAGNOSIS — Z3A35 35 weeks gestation of pregnancy: Secondary | ICD-10-CM | POA: Diagnosis not present

## 2022-04-19 DIAGNOSIS — O43893 Other placental disorders, third trimester: Secondary | ICD-10-CM | POA: Diagnosis not present

## 2022-04-19 DIAGNOSIS — O1414 Severe pre-eclampsia complicating childbirth: Secondary | ICD-10-CM | POA: Diagnosis not present

## 2022-04-19 DIAGNOSIS — O24420 Gestational diabetes mellitus in childbirth, diet controlled: Secondary | ICD-10-CM | POA: Diagnosis not present

## 2022-04-19 DIAGNOSIS — O365930 Maternal care for other known or suspected poor fetal growth, third trimester, not applicable or unspecified: Secondary | ICD-10-CM | POA: Diagnosis not present

## 2022-04-19 DIAGNOSIS — O9902 Anemia complicating childbirth: Secondary | ICD-10-CM | POA: Diagnosis not present

## 2022-04-19 DIAGNOSIS — O1413 Severe pre-eclampsia, third trimester: Secondary | ICD-10-CM | POA: Diagnosis not present

## 2022-04-19 DIAGNOSIS — O9952 Diseases of the respiratory system complicating childbirth: Secondary | ICD-10-CM | POA: Diagnosis not present

## 2022-04-19 DIAGNOSIS — D649 Anemia, unspecified: Secondary | ICD-10-CM | POA: Diagnosis not present

## 2022-04-19 DIAGNOSIS — J45909 Unspecified asthma, uncomplicated: Secondary | ICD-10-CM | POA: Diagnosis not present

## 2022-04-19 DIAGNOSIS — O99214 Obesity complicating childbirth: Secondary | ICD-10-CM | POA: Diagnosis not present

## 2022-04-19 DIAGNOSIS — Z3A34 34 weeks gestation of pregnancy: Secondary | ICD-10-CM | POA: Diagnosis not present

## 2022-04-26 DIAGNOSIS — Z3483 Encounter for supervision of other normal pregnancy, third trimester: Secondary | ICD-10-CM | POA: Diagnosis not present

## 2022-04-26 DIAGNOSIS — Z3482 Encounter for supervision of other normal pregnancy, second trimester: Secondary | ICD-10-CM | POA: Diagnosis not present

## 2022-04-28 DIAGNOSIS — O9953 Diseases of the respiratory system complicating the puerperium: Secondary | ICD-10-CM | POA: Diagnosis not present

## 2022-04-28 DIAGNOSIS — O99355 Diseases of the nervous system complicating the puerperium: Secondary | ICD-10-CM | POA: Diagnosis not present

## 2022-04-28 DIAGNOSIS — F419 Anxiety disorder, unspecified: Secondary | ICD-10-CM | POA: Diagnosis not present

## 2022-04-28 DIAGNOSIS — O24430 Gestational diabetes mellitus in the puerperium, diet controlled: Secondary | ICD-10-CM | POA: Diagnosis not present

## 2022-04-28 DIAGNOSIS — O135 Gestational [pregnancy-induced] hypertension without significant proteinuria, complicating the puerperium: Secondary | ICD-10-CM | POA: Diagnosis not present

## 2022-04-28 DIAGNOSIS — J45909 Unspecified asthma, uncomplicated: Secondary | ICD-10-CM | POA: Diagnosis not present

## 2022-04-28 DIAGNOSIS — G43909 Migraine, unspecified, not intractable, without status migrainosus: Secondary | ICD-10-CM | POA: Diagnosis not present

## 2022-04-28 DIAGNOSIS — O9903 Anemia complicating the puerperium: Secondary | ICD-10-CM | POA: Diagnosis not present

## 2022-04-28 DIAGNOSIS — O99345 Other mental disorders complicating the puerperium: Secondary | ICD-10-CM | POA: Diagnosis not present

## 2022-04-28 DIAGNOSIS — O8600 Infection of obstetric surgical wound, unspecified: Secondary | ICD-10-CM | POA: Diagnosis not present

## 2022-04-28 DIAGNOSIS — O99215 Obesity complicating the puerperium: Secondary | ICD-10-CM | POA: Diagnosis not present

## 2022-05-23 IMAGING — US US OB COMP LESS 14 WK
1 series · 15 of 28 positions shown · non-contrast
Comparison: None.

CLINICAL DATA: Pelvic pain x1 day.

EXAM:
OBSTETRIC <14 WK ULTRASOUND
TECHNIQUE: Transabdominal ultrasound was performed for evaluation of the
gestation as well as the maternal uterus and adnexal regions.

[Series 1: us ob comp less 14 wks mc & wl · 67 acquisitions, 15 frames shown]
[im 1/67]
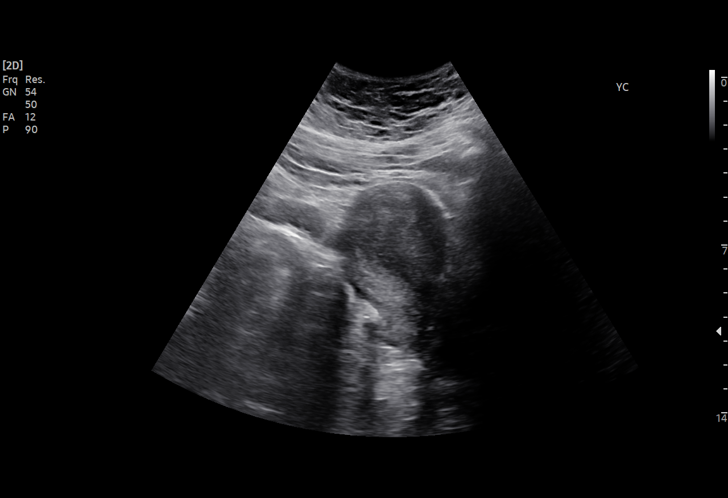
[im 5/67]
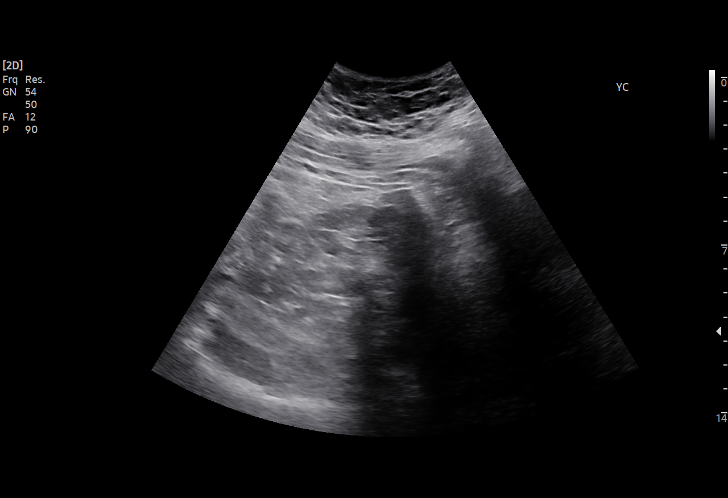
[im 10/67]
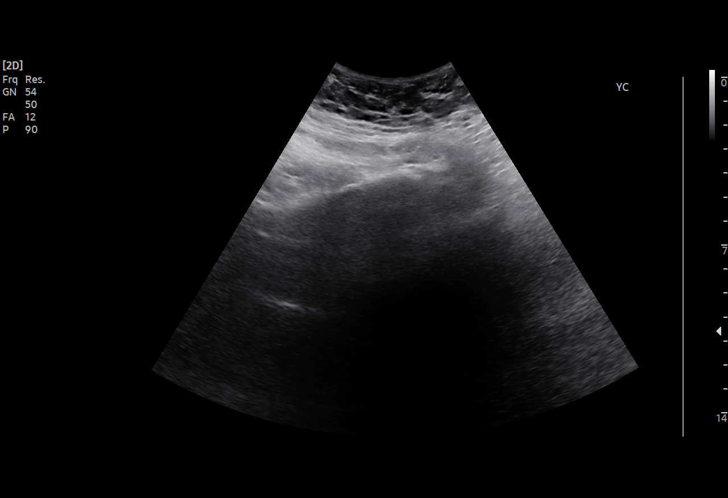
[im 15/67]
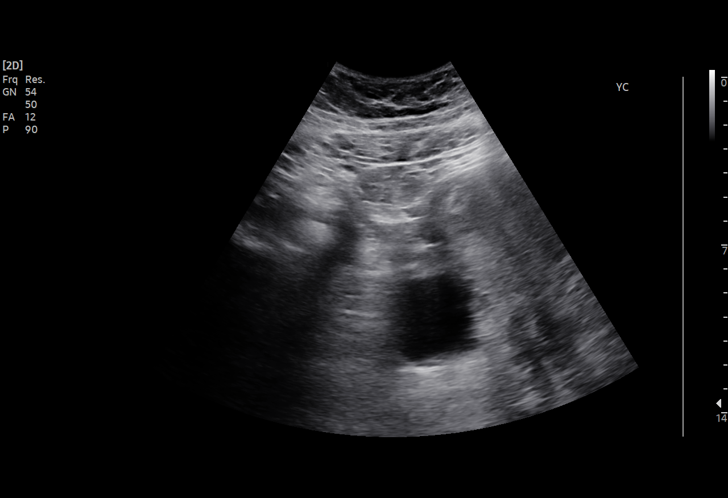
[im 20/67]
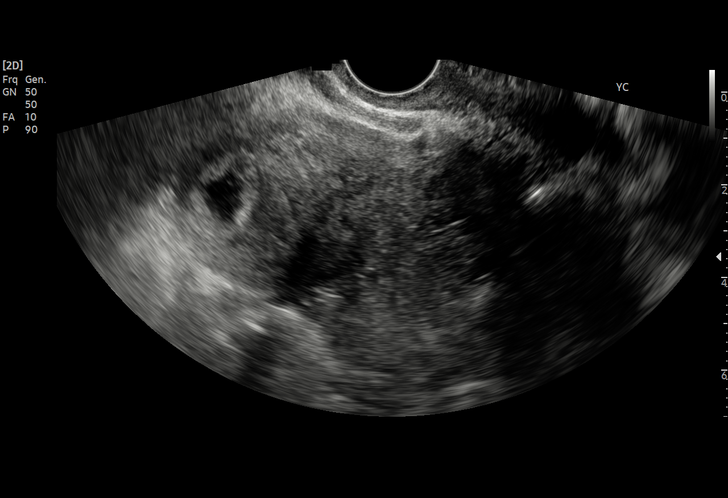
[im 25/67]
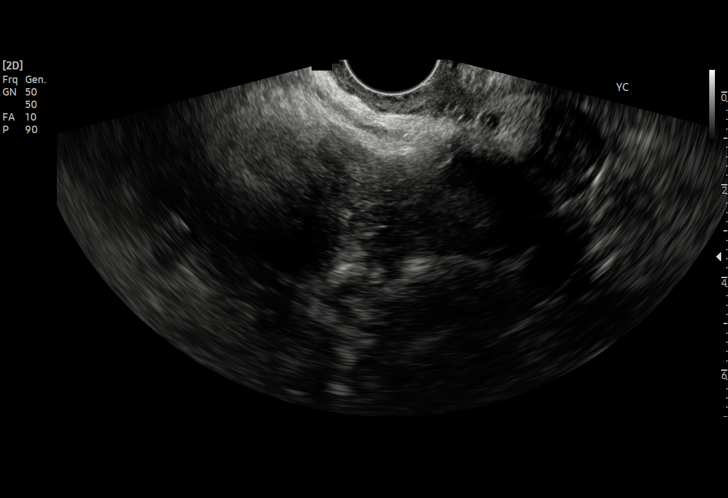
[im 30/67]
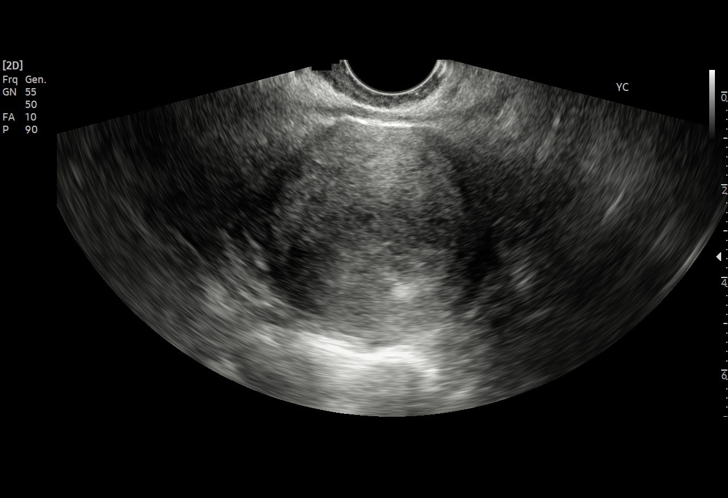
[im 35/67]
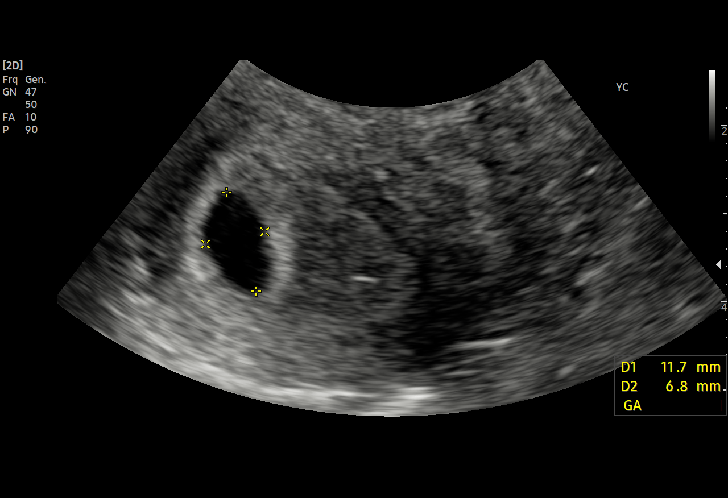
[im 37/67]
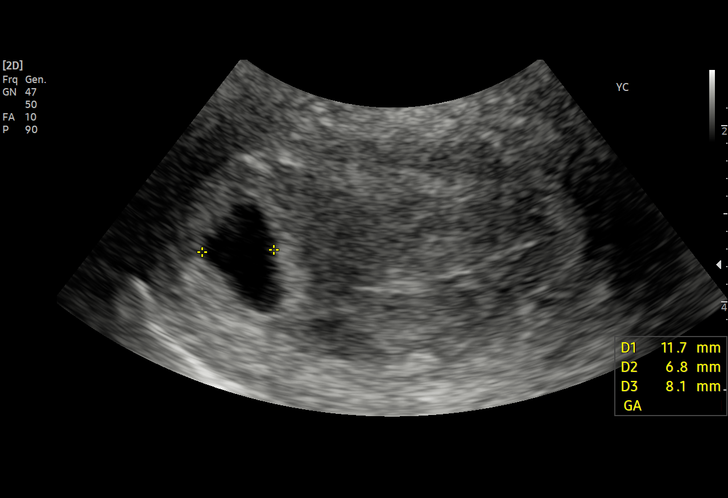
[im 42/67]
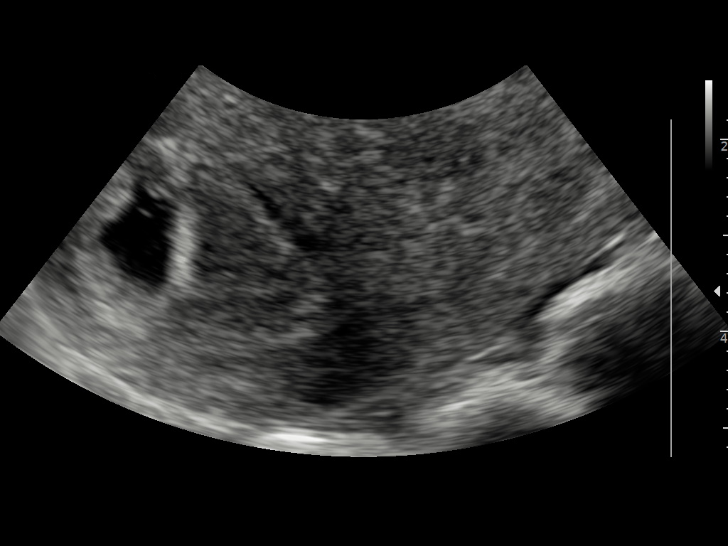
[im 47/67]
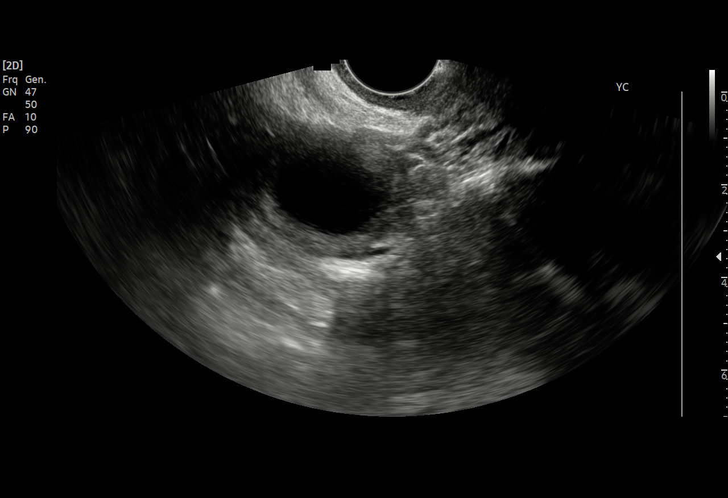
[im 52/67]
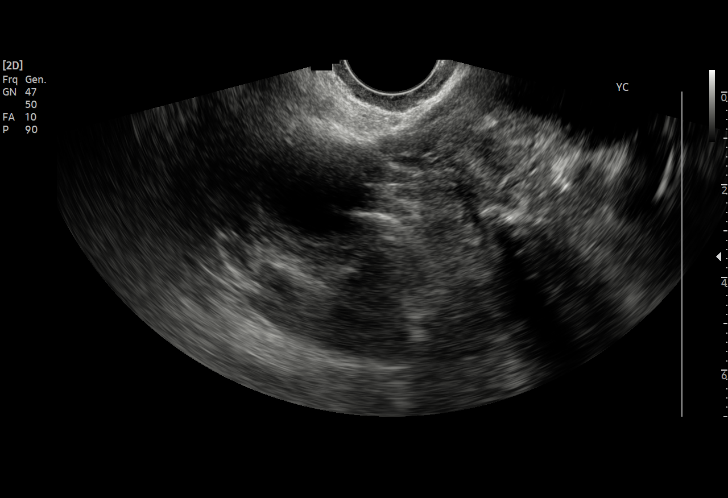
[im 57/67]
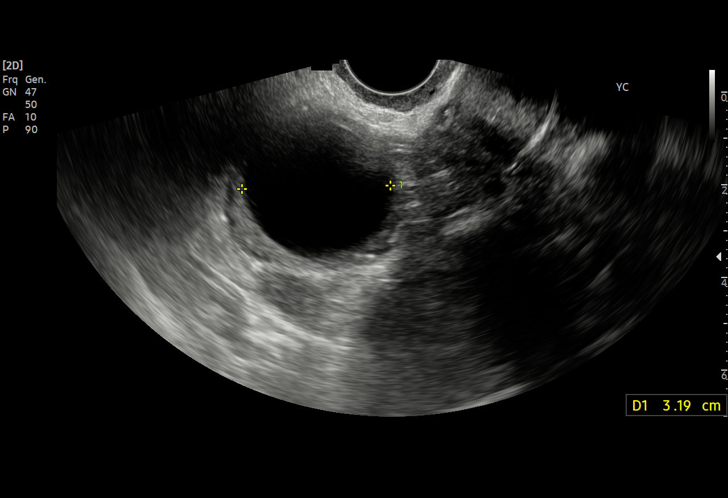
[im 62/67]
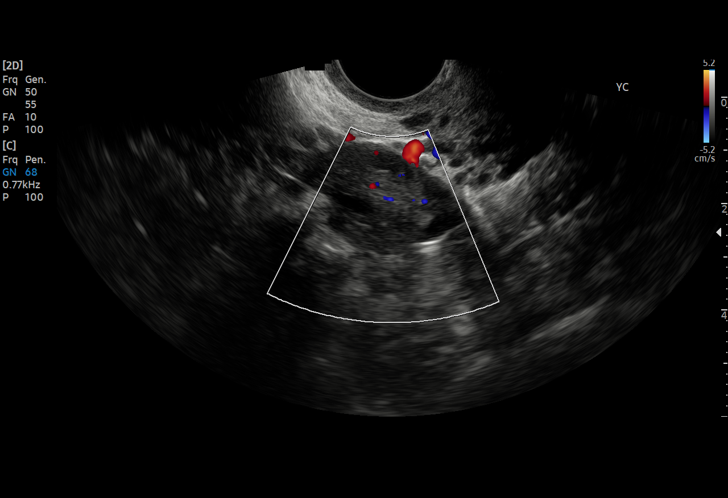
[im 67/67]
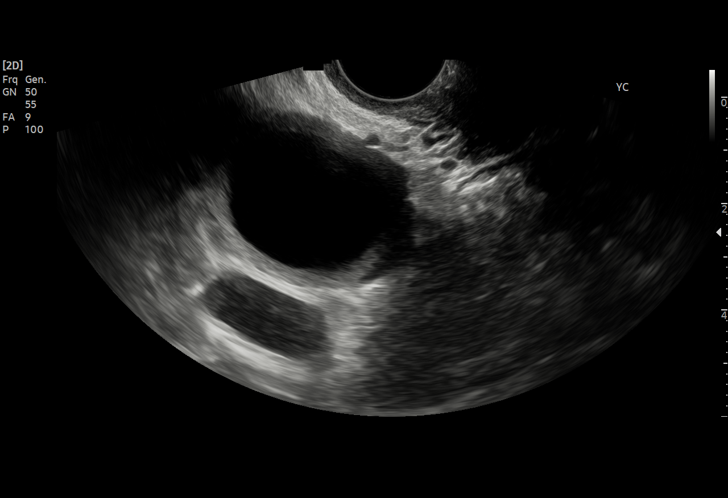

[15 of 28 positions shown; findings below may reference images not displayed]

FINDINGS: Intrauterine gestational sac: Single

Yolk sac:  Visualized.

Embryo:  Visualized.

Cardiac Activity: Not Visualized.

Heart Rate: N/A bpm

CRL:   2.2 mm   5 w 5 d                  US EDC: May 23, 2024

Subchorionic hemorrhage:  None visualized.

Maternal uterus/adnexae: A 3.5 cm x 2.7 cm x 3.2 cm cyst is seen
within the right ovary.

The left ovary is visualized and is normal in appearance.

No pelvic free fluid is seen.
IMPRESSION: Findings are suspicious but not yet definitive for failed pregnancy.
Recommend follow-up US in 10-14 days for definitive diagnosis. This
recommendation follows SRU consensus guidelines: Diagnostic Criteria
for Nonviable Pregnancy Early in the First Trimester. N Engl J Med

## 2022-05-25 DIAGNOSIS — Z30430 Encounter for insertion of intrauterine contraceptive device: Secondary | ICD-10-CM | POA: Diagnosis not present

## 2022-05-25 DIAGNOSIS — Z3202 Encounter for pregnancy test, result negative: Secondary | ICD-10-CM | POA: Diagnosis not present

## 2022-06-12 IMAGING — US US OB COMP LESS 14 WK
1 series · 14 of 21 positions shown · non-contrast
Comparison: 09/25/2021

CLINICAL DATA: Pregnant, for viability

EXAM:
OBSTETRIC <14 WK US AND TRANSVAGINAL OB US
TECHNIQUE: Both transabdominal and transvaginal ultrasound examinations were
performed for complete evaluation of the gestation as well as the
maternal uterus, adnexal regions, and pelvic cul-de-sac.
Transvaginal technique was performed to assess early pregnancy.

[Series 1: us ob comp less 14 wks · 21 acquisitions, 14 frames shown]
[im 1/21]
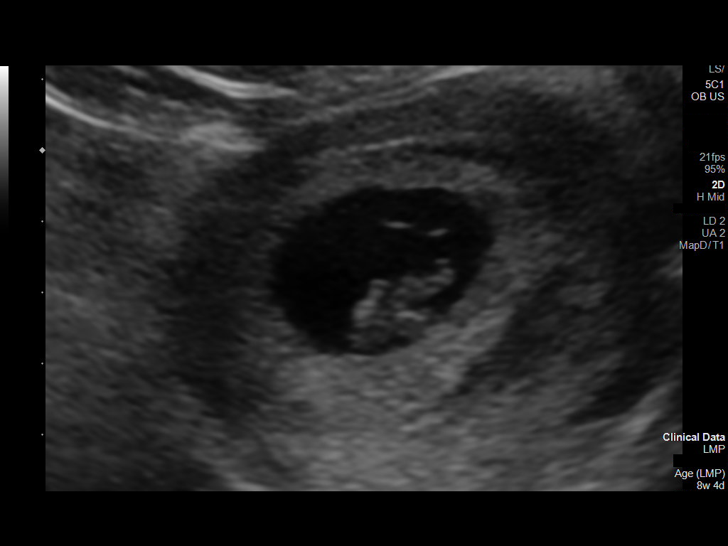
[im 3/21]
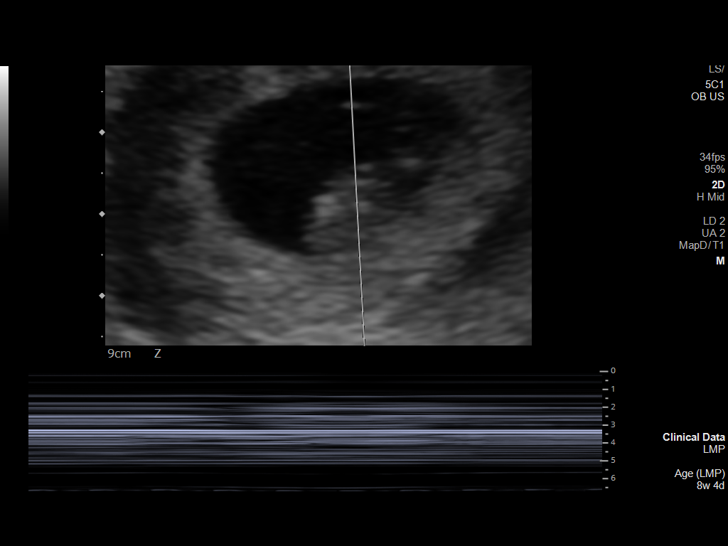
[im 4/21]
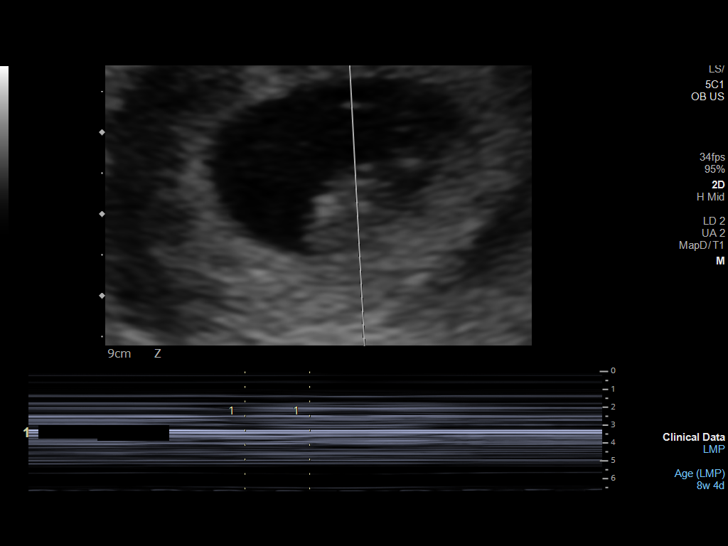
[im 6/21]
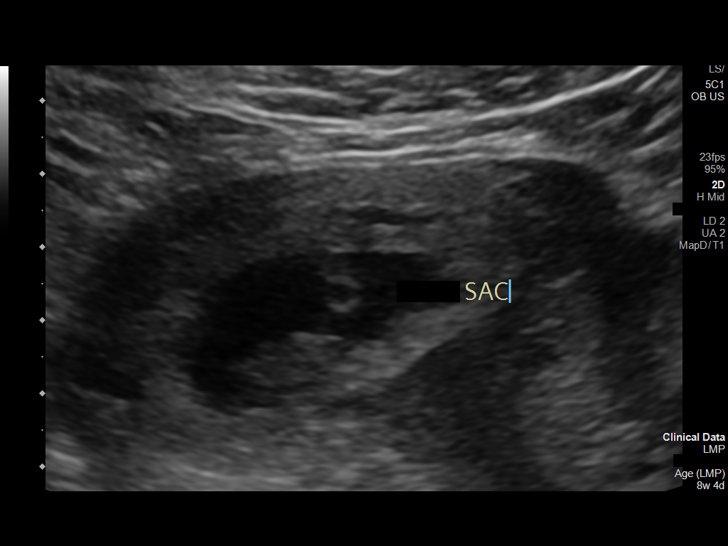
[im 7/21]
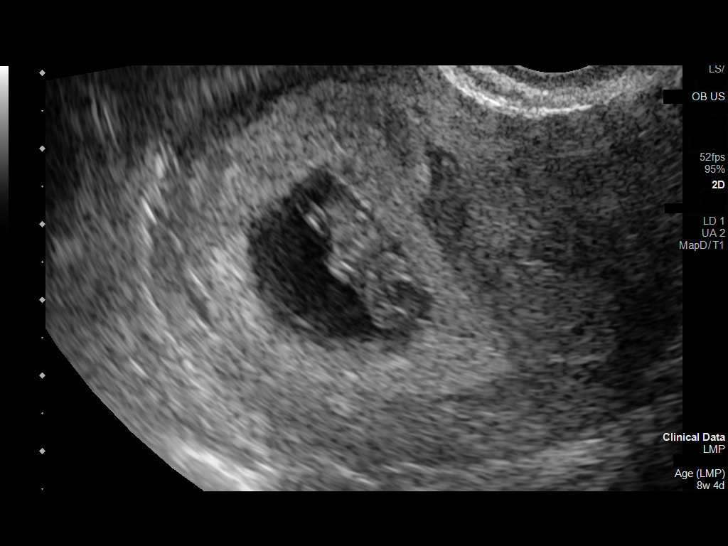
[im 9/21]
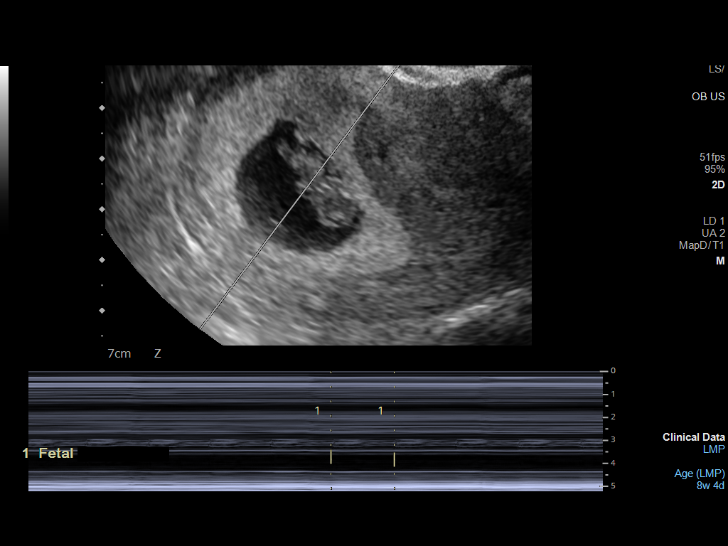
[im 10/21]
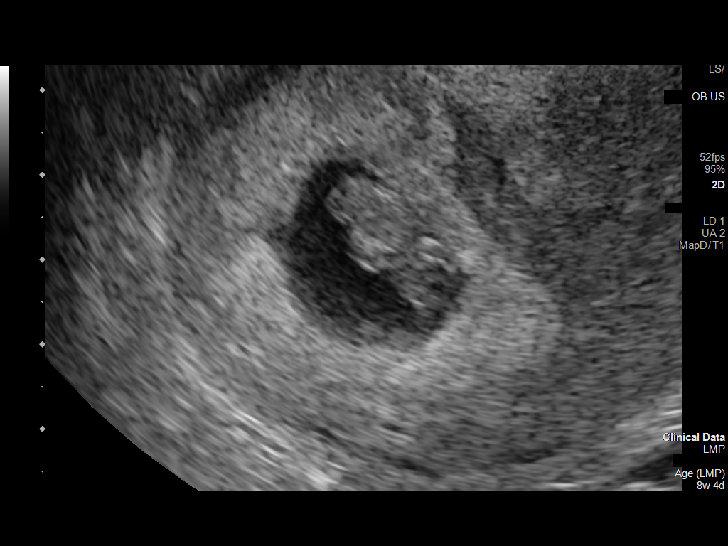
[im 12/21]
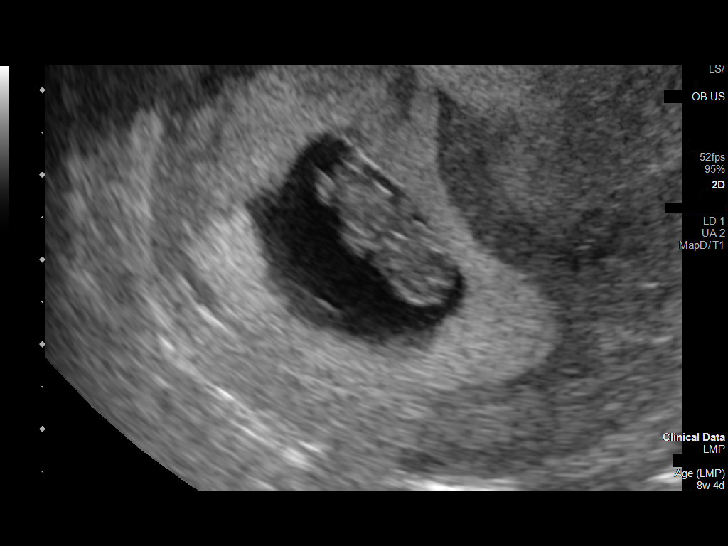
[im 13/21]
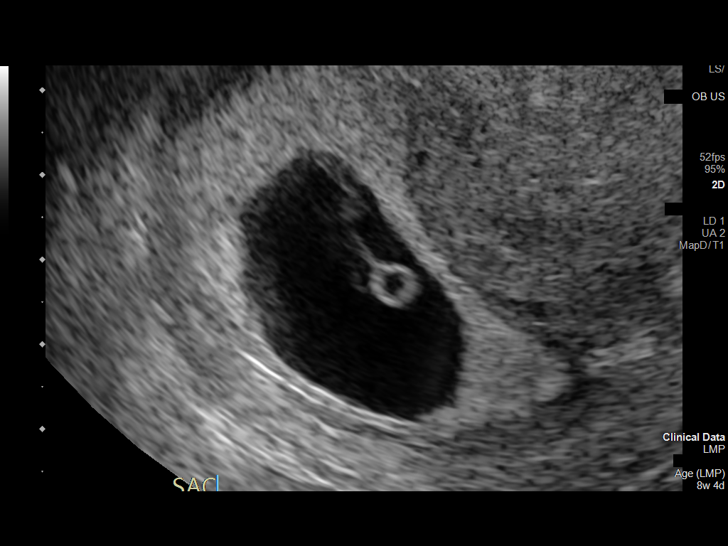
[im 15/21]
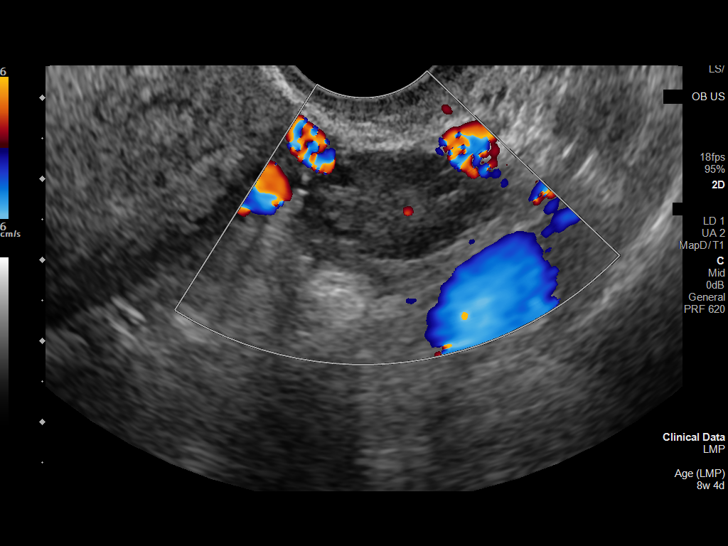
[im 16/21]
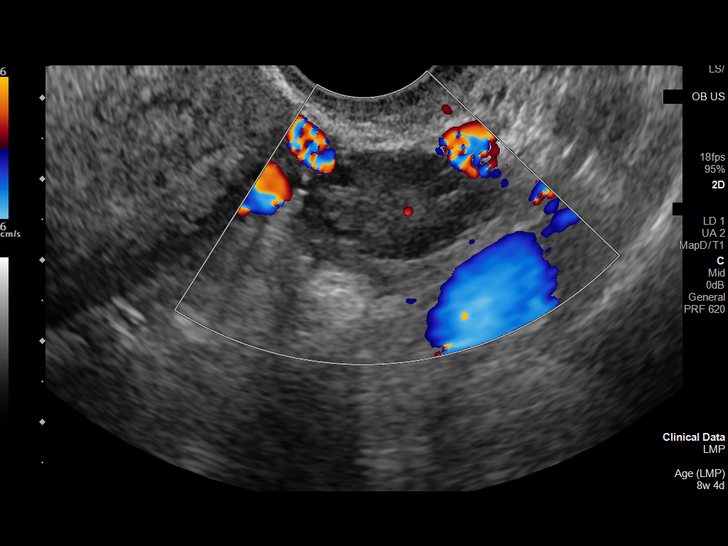
[im 18/21]
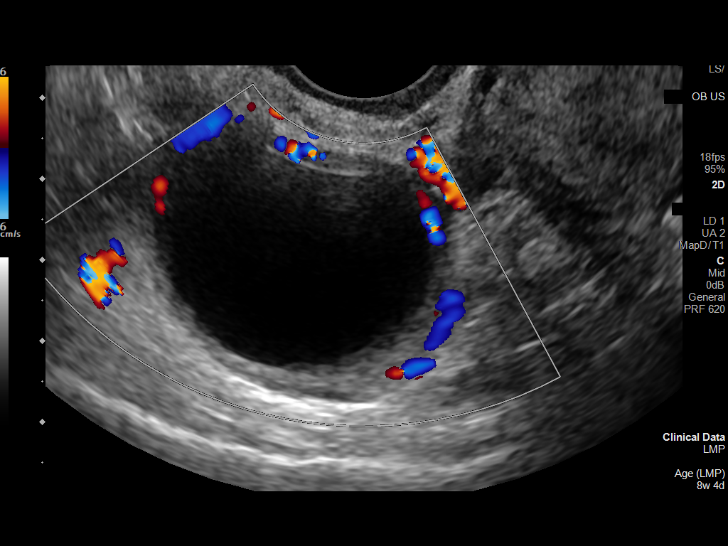
[im 19/21]
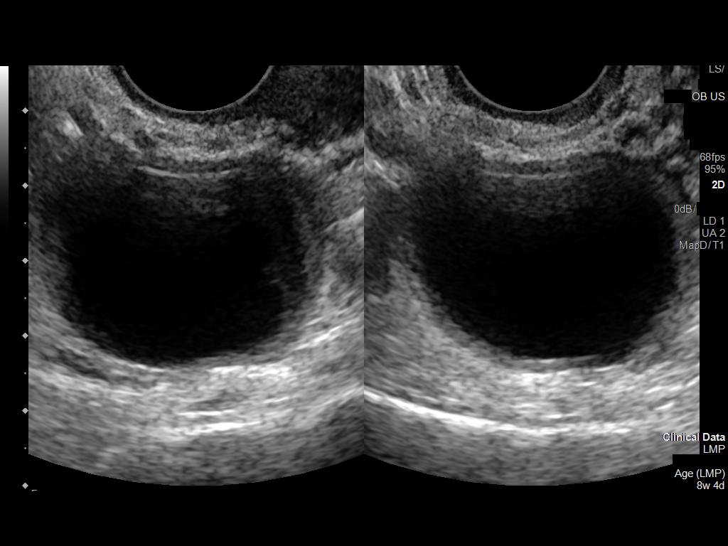
[im 21/21]
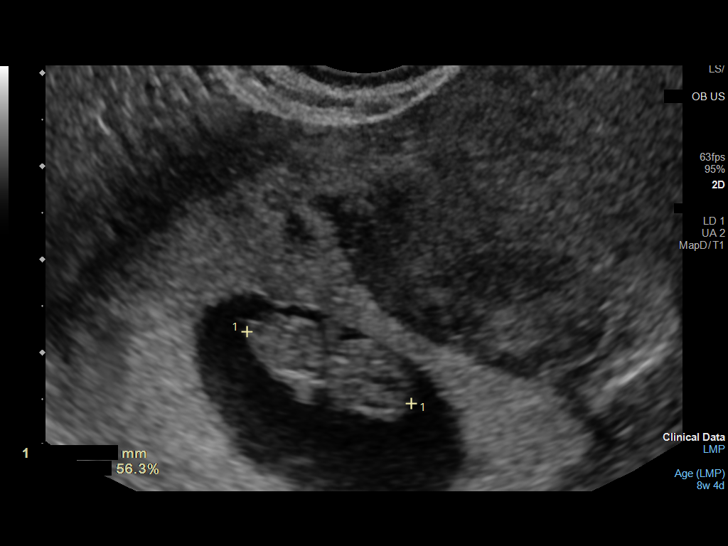

[14 of 21 positions shown; findings below may reference images not displayed]

FINDINGS: Intrauterine gestational sac: Single

Yolk sac:  Visualized.

Embryo:  Visualized.

Cardiac Activity: Visualized.

Heart Rate: 162 bpm

CRL:  19.2 mm   8 w   3 d                  US EDC: 05/24/2022

Subchorionic hemorrhage:  None visualized.

Maternal uterus/adnexae: Bilateral ovaries are within normal limits,
noting a 3.4 cm corpus luteum on the right.

No free fluid.
IMPRESSION: Single intrauterine gestation with cardiac activity, measuring 8
weeks 3 days by crown-rump length, as above.

## 2022-06-12 IMAGING — US US OB TRANSVAGINAL
1 series · 14 of 21 positions shown · non-contrast
Comparison: 09/25/2021

CLINICAL DATA: Pregnant, for viability

EXAM:
OBSTETRIC <14 WK US AND TRANSVAGINAL OB US
TECHNIQUE: Both transabdominal and transvaginal ultrasound examinations were
performed for complete evaluation of the gestation as well as the
maternal uterus, adnexal regions, and pelvic cul-de-sac.
Transvaginal technique was performed to assess early pregnancy.

[Series 1: us ob comp less 14 wks · 21 acquisitions, 14 frames shown]
[im 1/21]
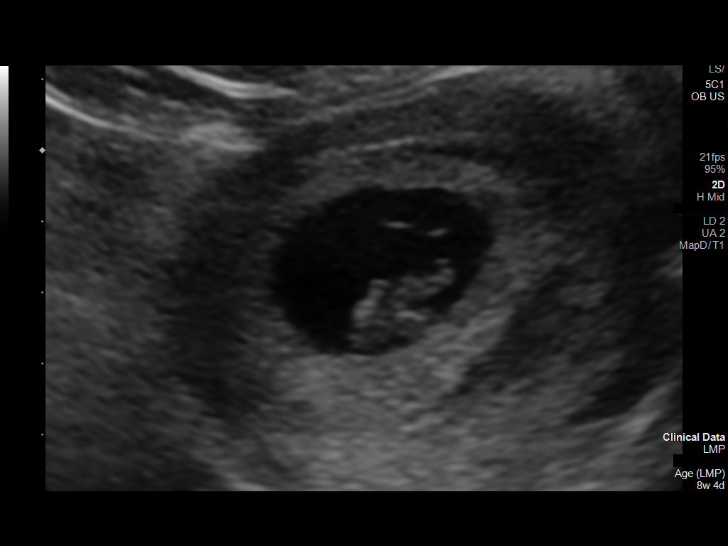
[im 3/21]
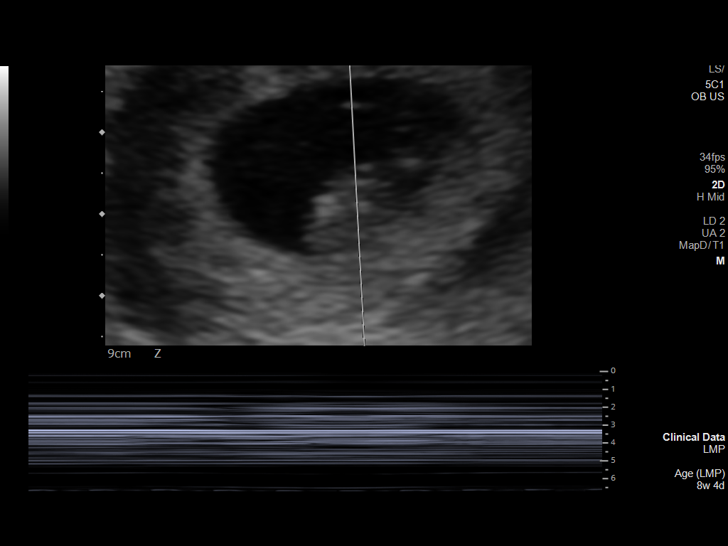
[im 4/21]
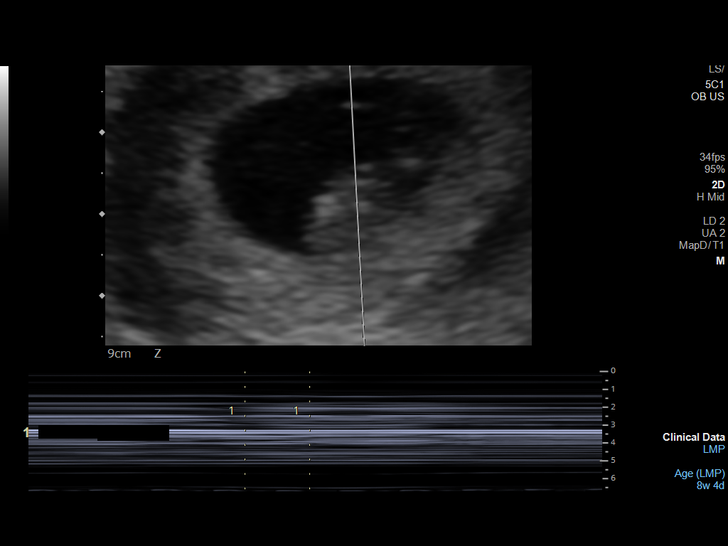
[im 6/21]
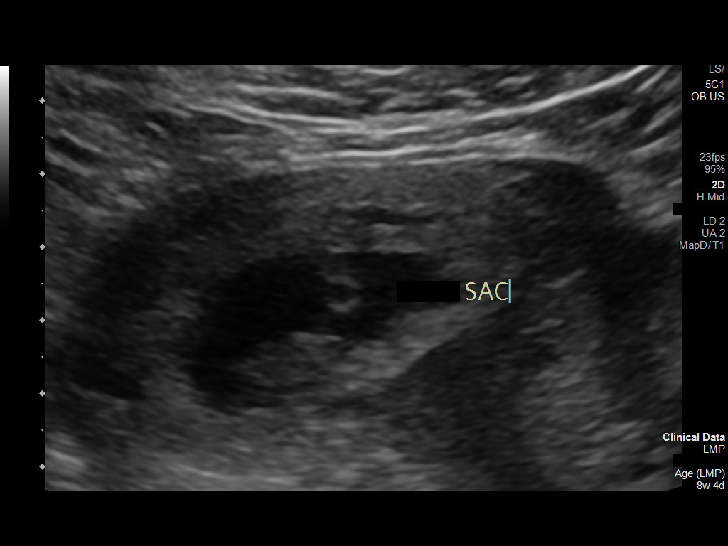
[im 7/21]
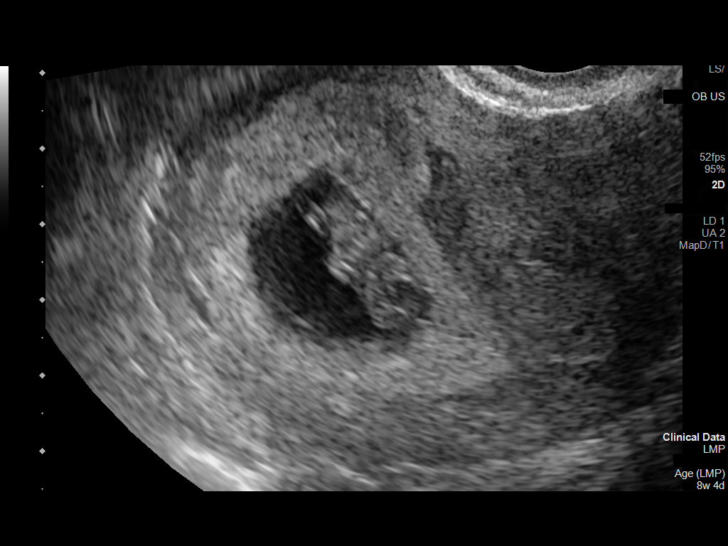
[im 9/21]
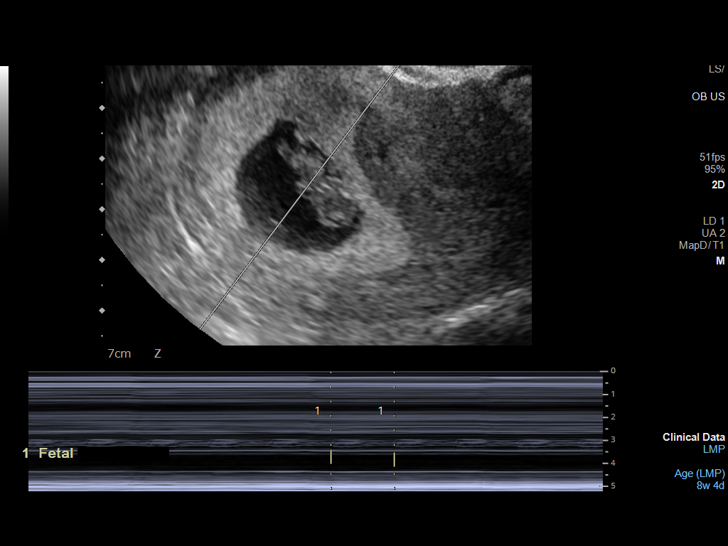
[im 10/21]
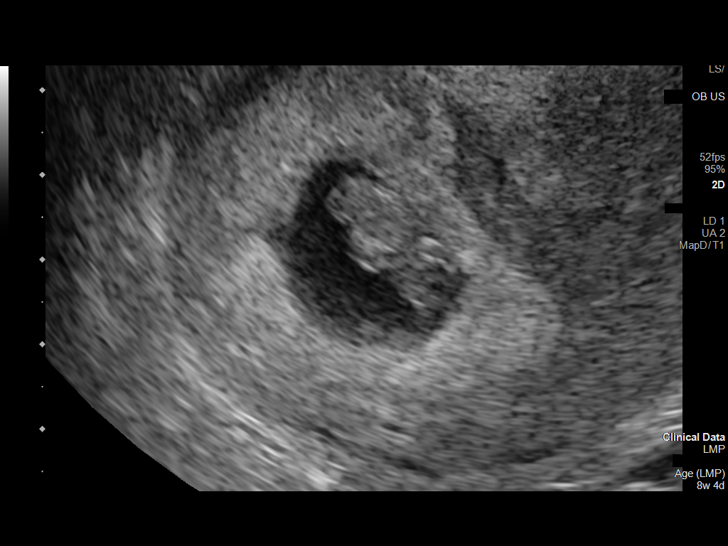
[im 12/21]
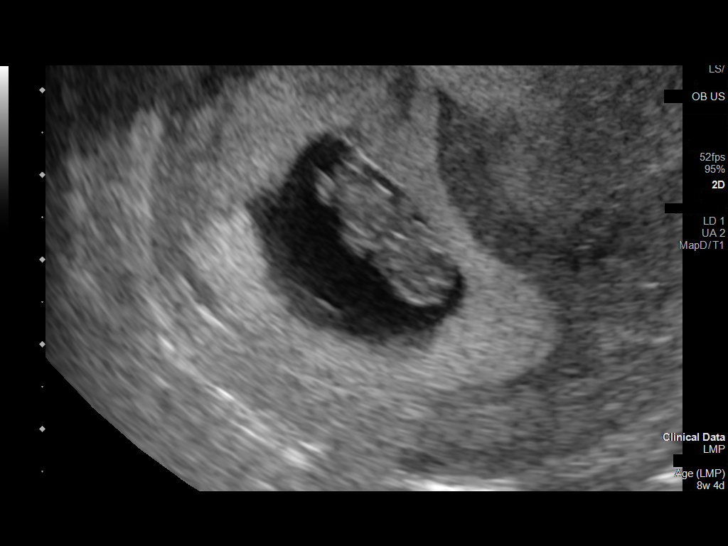
[im 13/21]
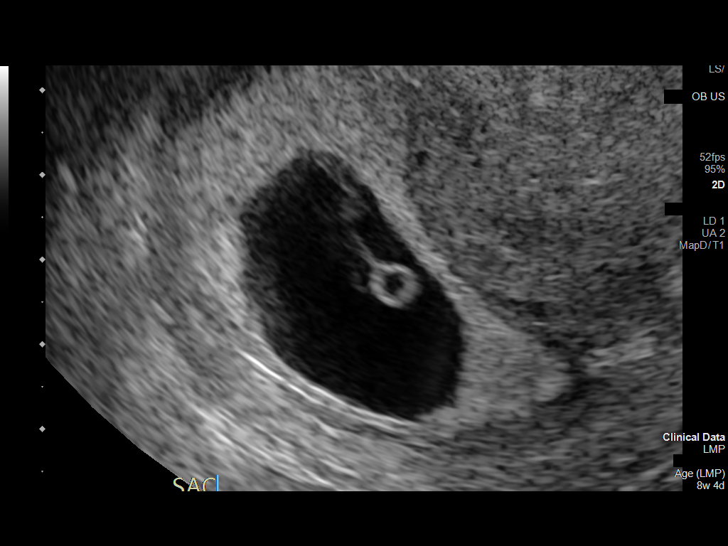
[im 15/21]
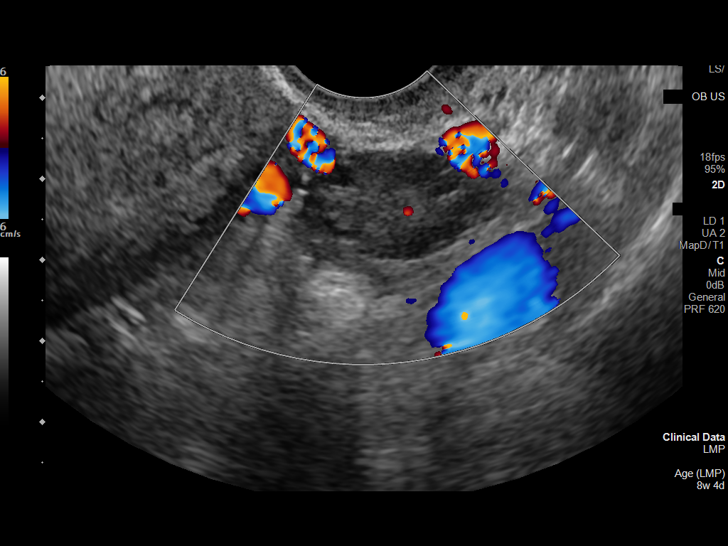
[im 16/21]
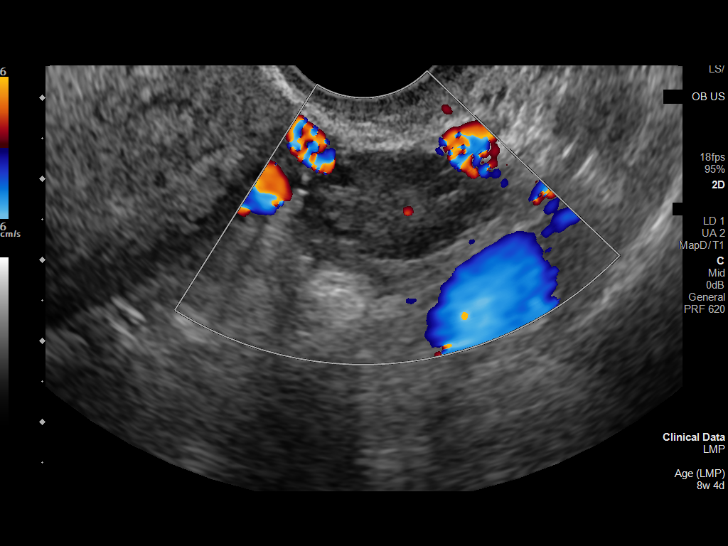
[im 18/21]
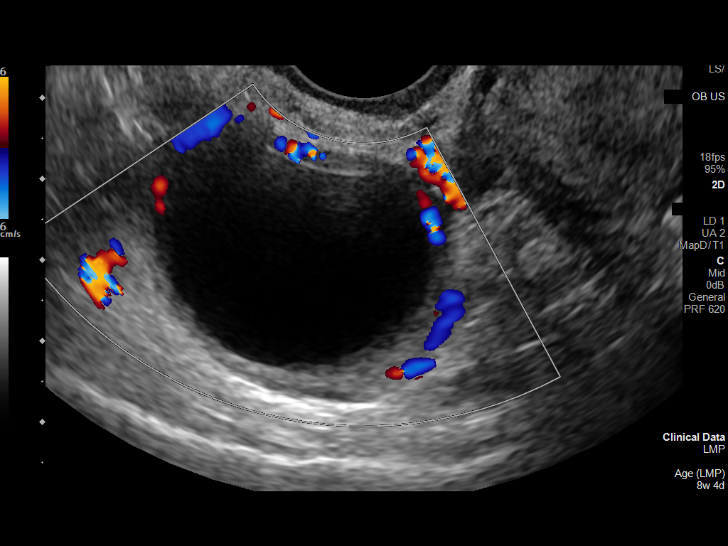
[im 19/21]
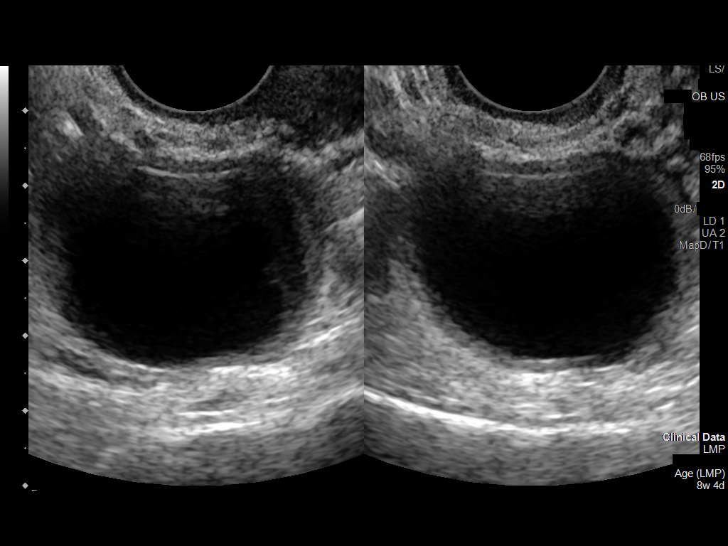
[im 21/21]
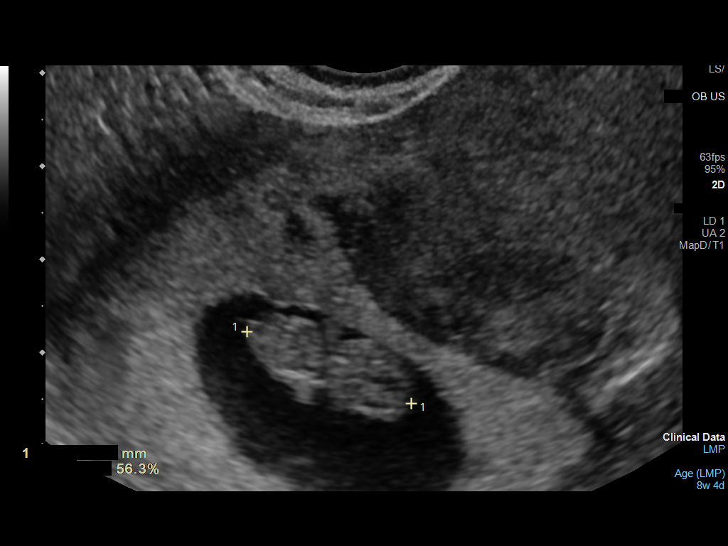

[14 of 21 positions shown; findings below may reference images not displayed]

FINDINGS: Intrauterine gestational sac: Single

Yolk sac:  Visualized.

Embryo:  Visualized.

Cardiac Activity: Visualized.

Heart Rate: 162 bpm

CRL:  19.2 mm   8 w   3 d                  US EDC: 05/24/2022

Subchorionic hemorrhage:  None visualized.

Maternal uterus/adnexae: Bilateral ovaries are within normal limits,
noting a 3.4 cm corpus luteum on the right.

No free fluid.
IMPRESSION: Single intrauterine gestation with cardiac activity, measuring 8
weeks 3 days by crown-rump length, as above.

## 2022-06-13 DIAGNOSIS — L739 Follicular disorder, unspecified: Secondary | ICD-10-CM | POA: Diagnosis not present

## 2022-06-28 DIAGNOSIS — F411 Generalized anxiety disorder: Secondary | ICD-10-CM | POA: Diagnosis not present

## 2022-06-28 DIAGNOSIS — Z79899 Other long term (current) drug therapy: Secondary | ICD-10-CM | POA: Diagnosis not present

## 2022-07-06 DIAGNOSIS — F32A Depression, unspecified: Secondary | ICD-10-CM | POA: Diagnosis not present

## 2022-07-06 DIAGNOSIS — Z98891 History of uterine scar from previous surgery: Secondary | ICD-10-CM | POA: Diagnosis not present

## 2022-07-06 DIAGNOSIS — Z30431 Encounter for routine checking of intrauterine contraceptive device: Secondary | ICD-10-CM | POA: Diagnosis not present

## 2022-07-06 IMAGING — US US OB COMP LESS 14 WK
1 series · 15 of 28 positions shown · non-contrast
Comparison: Obstetrical ultrasound 10/15/2021.

CLINICAL DATA: Vaginal bleeding.

EXAM:
OBSTETRIC <14 WK ULTRASOUND
TECHNIQUE: Transabdominal ultrasound was performed for evaluation of the
gestation as well as the maternal uterus and adnexal regions.

[Series 1: us ob comp less 14 wk · 15 of 34 slices shown]
[im 1/34]
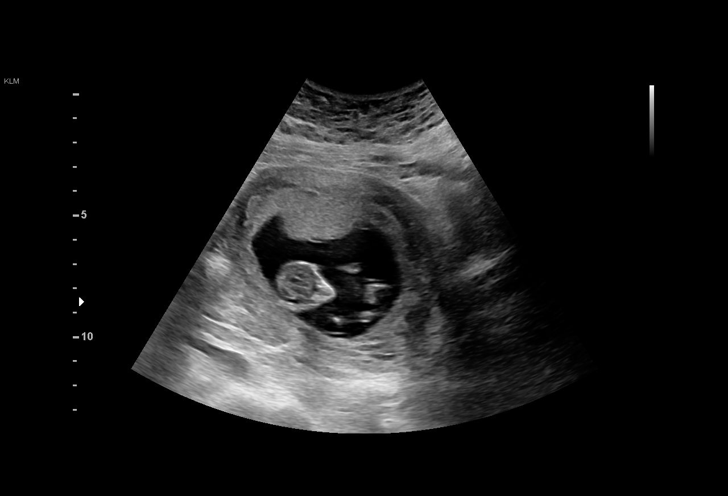
[im 3/34]
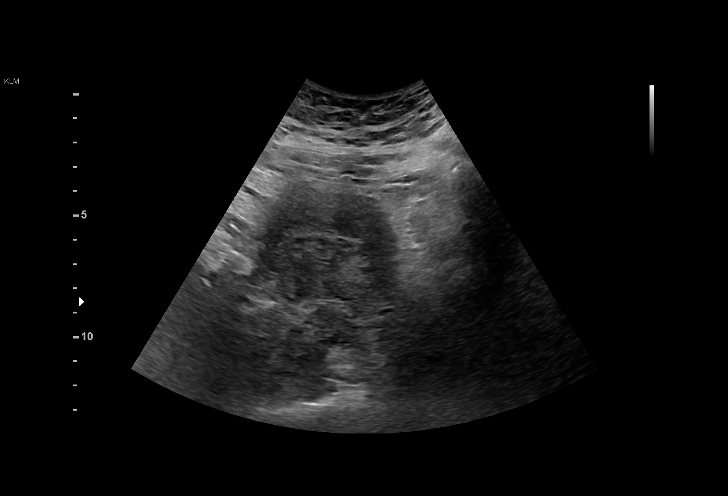
[im 5/34]
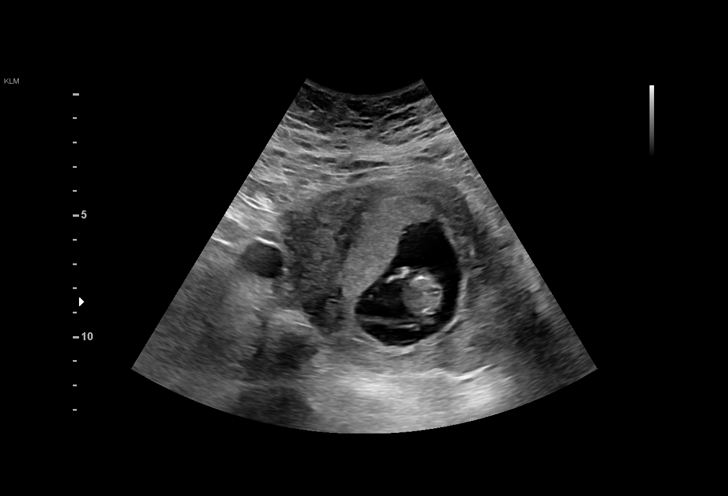
[im 8/34]
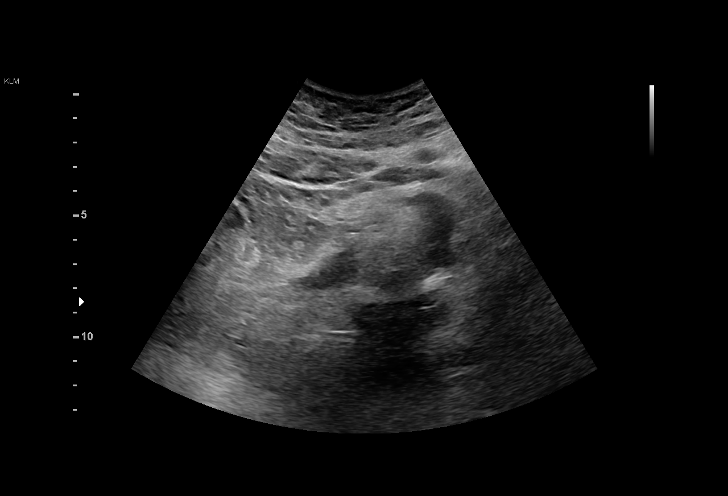
[im 10/34]
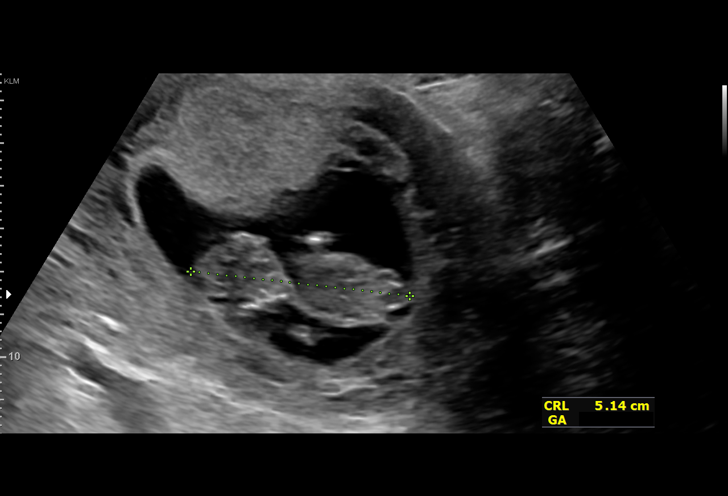
[im 13/34]
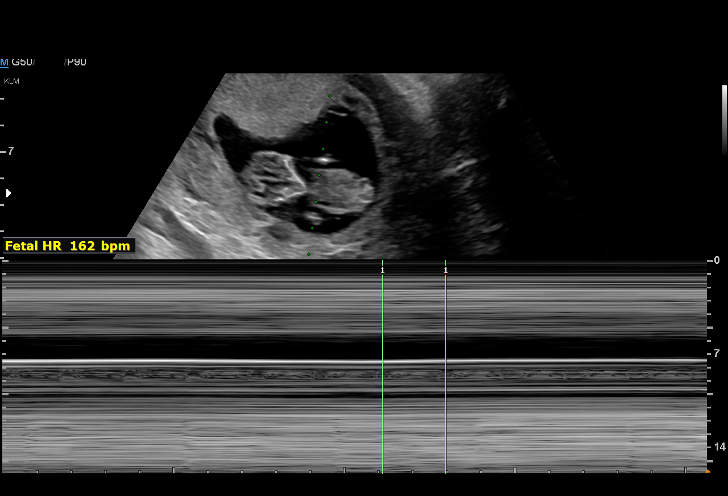
[im 15/34]
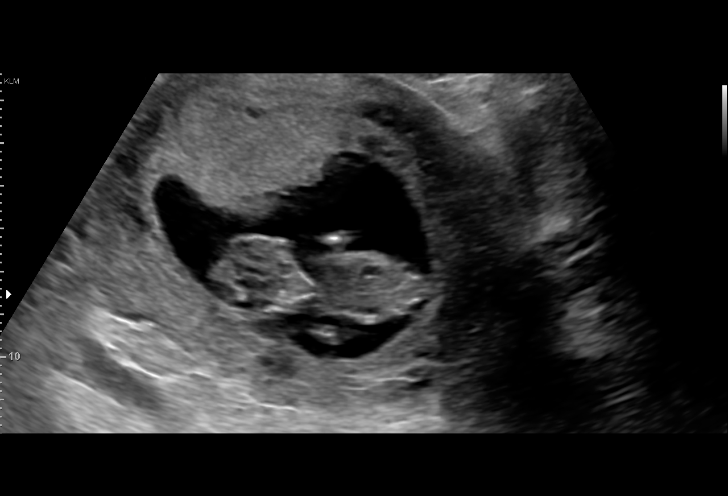
[im 18/34]
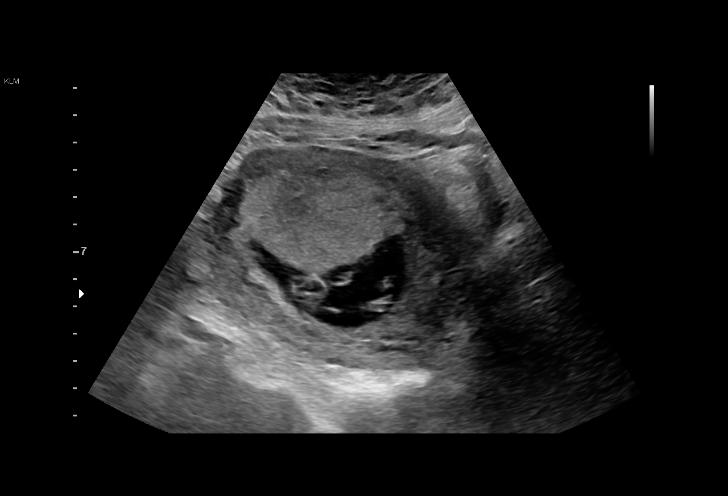
[im 19/34]
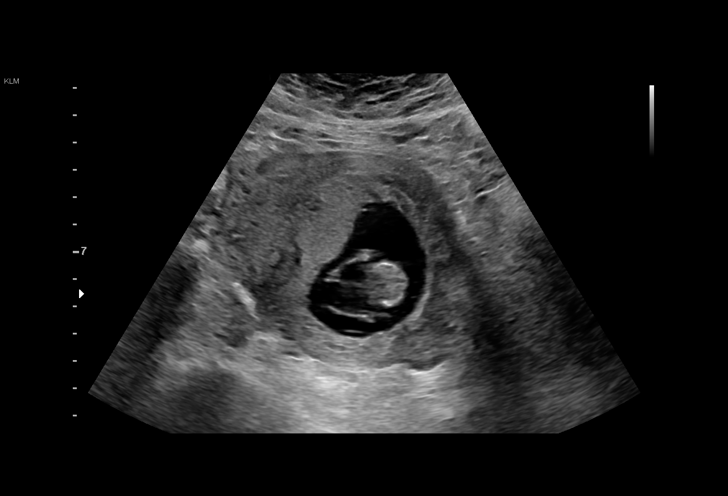
[im 21/34]
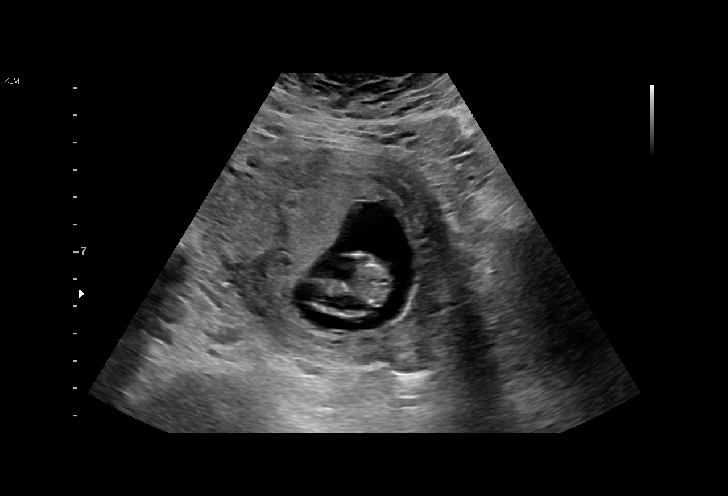
[im 24/34]
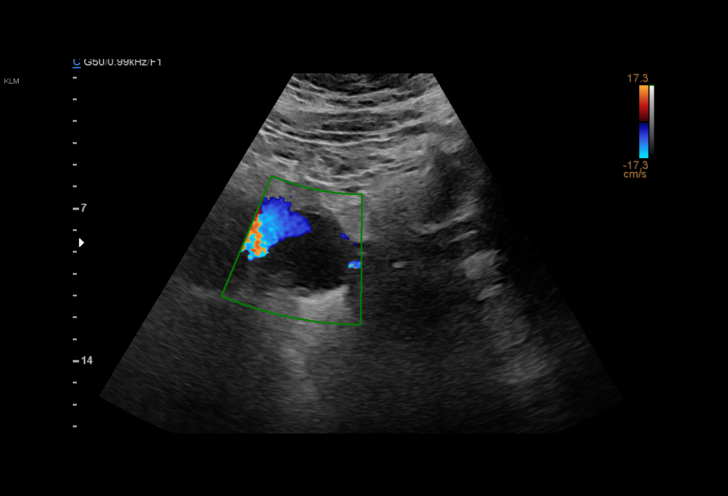
[im 26/34]
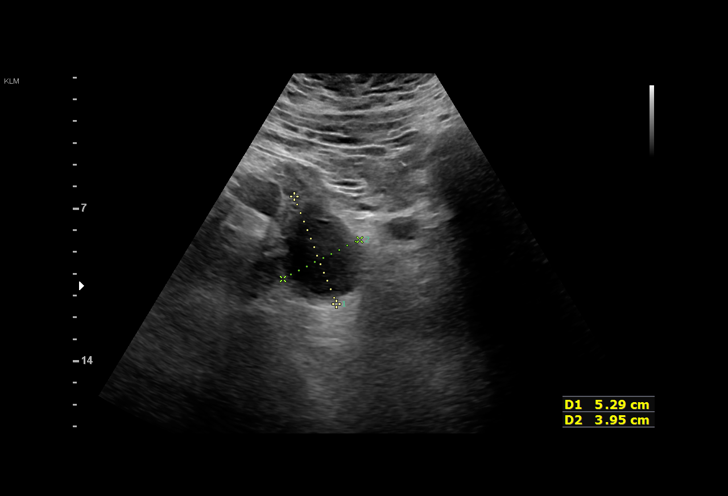
[im 29/34]
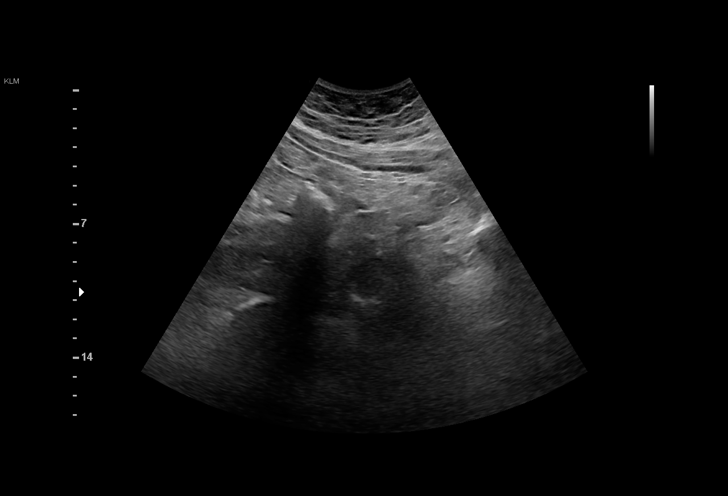
[im 31/34]
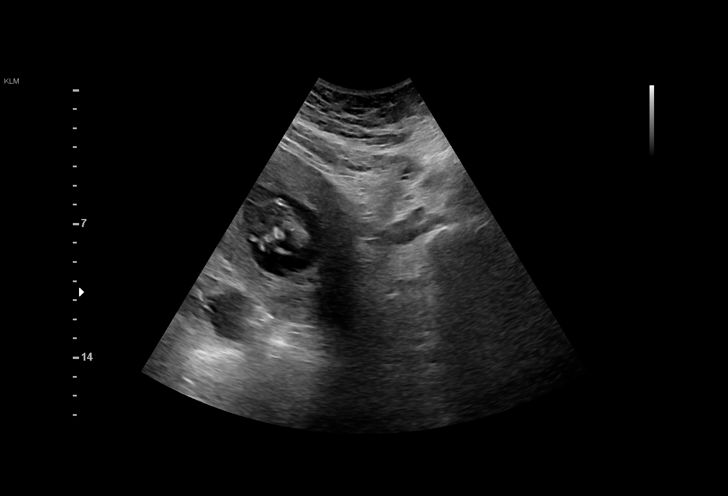
[im 34/34]
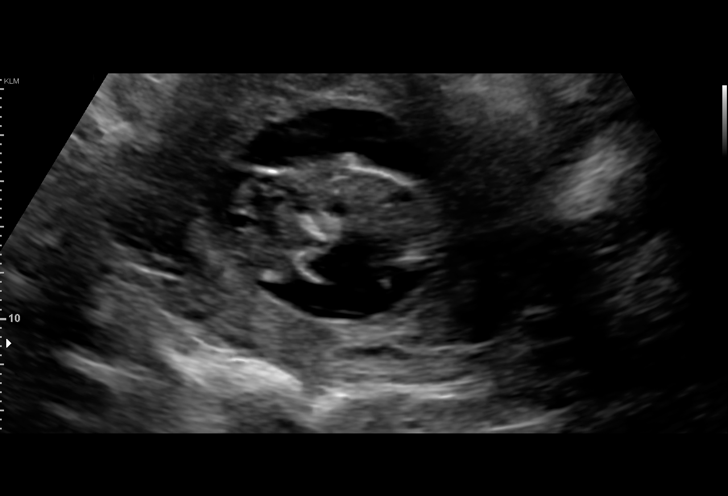

[15 of 28 positions shown; findings below may reference images not displayed]

FINDINGS: Intrauterine gestational sac: Single

Yolk sac:  Not Visualized.

Embryo:  Visualized.

Cardiac Activity: Visualized.

Heart Rate: 162 bpm

CRL:   51.3 mm   11 w 6 d                  US EDC: 05/24/2022

Subchorionic hemorrhage:  None visualized.

Maternal uterus/adnexae: There is a 3.4 x 2.8 x 3.1 cm simple cyst
in the right ovary. The left ovary is not visualized. There is no
pelvic free fluid.
IMPRESSION: 1. Single live intrauterine gestation measuring 11 weeks 6 days by
crown-rump length.
2. No subchorionic hemorrhage.
3. 3.4 cm right ovarian simple cyst. Recommend attention on
follow-up exams.

## 2022-08-18 ENCOUNTER — Ambulatory Visit: Payer: Medicaid Other | Admitting: Family

## 2022-09-02 ENCOUNTER — Ambulatory Visit: Payer: Medicaid Other | Admitting: Nurse Practitioner

## 2022-09-07 DIAGNOSIS — Z79899 Other long term (current) drug therapy: Secondary | ICD-10-CM | POA: Diagnosis not present

## 2022-09-07 DIAGNOSIS — F411 Generalized anxiety disorder: Secondary | ICD-10-CM | POA: Diagnosis not present

## 2022-09-30 DIAGNOSIS — Z79899 Other long term (current) drug therapy: Secondary | ICD-10-CM | POA: Diagnosis not present

## 2022-09-30 DIAGNOSIS — F411 Generalized anxiety disorder: Secondary | ICD-10-CM | POA: Diagnosis not present

## 2022-10-17 DIAGNOSIS — F32A Depression, unspecified: Secondary | ICD-10-CM | POA: Diagnosis not present

## 2022-10-17 DIAGNOSIS — Z79899 Other long term (current) drug therapy: Secondary | ICD-10-CM | POA: Diagnosis not present

## 2022-10-17 DIAGNOSIS — J45909 Unspecified asthma, uncomplicated: Secondary | ICD-10-CM | POA: Diagnosis not present

## 2022-10-17 DIAGNOSIS — G43909 Migraine, unspecified, not intractable, without status migrainosus: Secondary | ICD-10-CM | POA: Diagnosis not present

## 2022-11-01 DIAGNOSIS — J4521 Mild intermittent asthma with (acute) exacerbation: Secondary | ICD-10-CM | POA: Diagnosis not present

## 2022-12-14 ENCOUNTER — Encounter (HOSPITAL_COMMUNITY): Payer: Self-pay

## 2022-12-14 ENCOUNTER — Ambulatory Visit (HOSPITAL_COMMUNITY)
Admission: EM | Admit: 2022-12-14 | Discharge: 2022-12-14 | Disposition: A | Discharge status: Home / Self Care | Payer: Medicaid Other | Attending: Family Medicine | Admitting: Family Medicine

## 2022-12-14 ENCOUNTER — Ambulatory Visit (INDEPENDENT_AMBULATORY_CARE_PROVIDER_SITE_OTHER): Payer: Medicaid Other

## 2022-12-14 DIAGNOSIS — R059 Cough, unspecified: Secondary | ICD-10-CM

## 2022-12-14 DIAGNOSIS — J4521 Mild intermittent asthma with (acute) exacerbation: Secondary | ICD-10-CM

## 2022-12-14 DIAGNOSIS — R0602 Shortness of breath: Secondary | ICD-10-CM | POA: Diagnosis not present

## 2022-12-14 DIAGNOSIS — J069 Acute upper respiratory infection, unspecified: Secondary | ICD-10-CM | POA: Diagnosis not present

## 2022-12-14 MED ORDER — PREDNISONE 20 MG PO TABS: 40.0000 mg | ORAL_TABLET | Freq: Every day | ORAL | 0 refills | Status: DC

## 2022-12-14 MED ORDER — PROMETHAZINE-DM 6.25-15 MG/5ML PO SYRP: 5.0000 mL | ORAL_SOLUTION | Freq: Four times a day (QID) | ORAL | 0 refills | Status: DC | PRN

## 2022-12-14 NOTE — Discharge Instructions (Signed)
Your chest x-ray did not show any signs of pneumonia today.

## 2022-12-14 NOTE — ED Triage Notes (Signed)
Patient c/o a productive cough with a small amount yellow /green sputum, SOB, and nasal congestion x 5 days. Patient went to the [facility] today and ws told to obtain a CXR to r/o pneumonia.  Patient has had an albuterol neb treatment today, albuterol inhaler, Singulair, Flovent, and Zyrtec, Flonase, and Tylenol cold and Flu at 0900 today

## 2022-12-14 NOTE — ED Provider Notes (Signed)
[place]   [id number] 12/14/22 Arrival Time: 1025  ASSESSMENT & PLAN:  1. Viral URI with cough   2. Mild intermittent asthma with acute exacerbation    I have personally viewed the imaging studies ordered this visit. No acute changes on CXR. No PNA.  No resp distress. Has alb neb at home to use prn.  Discharge Medication List as of 12/14/2022 11:52 AM     START taking these medications   Details  predniSONE (DELTASONE) 20 MG tablet Take 2 tablets (40 mg total) by mouth daily., Starting Wed 12/14/2022, Normal    promethazine-dextromethorphan (PROMETHAZINE-DM) 6.25-15 MG/5ML syrup Take 5 mLs by mouth 4 (four) times daily as needed for cough., Starting Wed 12/14/2022, Normal         Follow-up Information     [facility] at [facility].   Specialty: Urgent Care Why: If worsening or failing to improve as anticipated. Contact information: [place] [id number] [phone]               Work note provided. Reviewed expectations re: course of current medical issues. Questions answered. Outlined signs and symptoms indicating need for more acute intervention. Understanding verbalized. After Visit Summary given.   SUBJECTIVE: History from: Patient. Tonya Melton is a 42 y.o. female. Patient c/o a productive cough with a small amount yellow /green sputum, SOB, and nasal congestion x 5 days. No assoc CP. Patient went to the [facility] today and ws told to obtain a CXR to r/o pneumonia. Patient has had an albuterol neb treatment today, albuterol inhaler, Singulair, Flovent, and Zyrtec, Flonase, and Tylenol cold and Flu at 0900 today. Neb with mild relief of described wheezing. Denies fever. Normal PO intake without n/v/d.  OBJECTIVE:  Vitals:   12/14/22 1106  BP: (!) 136/91  Pulse: 90  Resp: 18  Temp: 97.6 F (36.4 C)  TempSrc: Oral  SpO2: 95%    General appearance: alert; no distress Eyes: PERRLA;  EOMI; conjunctiva normal HENT: [place]; AT; with nasal congestion Neck: supple  Lungs: speaks full sentences without difficulty; unlabored; mild to mod bilateral exp wheezing Extremities: no edema Skin: warm and dry Neurologic: normal gait Psychological: alert and cooperative; normal mood and affect    Imaging: DG Chest 2 View  Result Date: 12/14/2022 CLINICAL DATA:  Cough and shortness of breath. EXAM: CHEST - 2 VIEW COMPARISON:  08/07/2021 FINDINGS: The heart size and mediastinal contours are within normal limits. Both lungs are clear. The visualized skeletal structures are unremarkable. IMPRESSION: No active cardiopulmonary disease. Electronically Signed   By: Kerby Moors M.D.   On: 12/14/2022 11:38    Allergies  Allergen Reactions   Amoxicillin    Biaxin [Clarithromycin]    Doxycycline    Penicillins     Past Medical History:  Diagnosis Date   Anxiety    Asthma    Seizures ([place])    Social History   Socioeconomic History   Marital status: Married    Spouse name: Not on file   Number of children: Not on file   Years of education: Not on file   Highest education level: Not on file  Occupational History   Not on file  Tobacco Use   Smoking status: Never   Smokeless tobacco: Never  Vaping Use   Vaping Use: Never used  Substance and Sexual Activity   Alcohol use: Yes   Drug use: Never   Sexual activity: Not on file  Other  Topics Concern   Not on file  Social History Narrative   Not on file   Social Determinants of Health   Financial Resource Strain: Not on file  Food Insecurity: Not on file  Transportation Needs: Not on file  Physical Activity: Not on file  Stress: Not on file  Social Connections: Not on file  Intimate Partner Violence: Not on file   History reviewed. No pertinent family history. Past Surgical History:  Procedure Laterality Date   deviated septum  2019     Vanessa Kick, MD 12/14/22 1429

## 2022-12-19 ENCOUNTER — Ambulatory Visit (INDEPENDENT_AMBULATORY_CARE_PROVIDER_SITE_OTHER): Payer: Medicaid Other

## 2022-12-19 ENCOUNTER — Encounter (HOSPITAL_COMMUNITY): Payer: Self-pay | Admitting: Emergency Medicine

## 2022-12-19 ENCOUNTER — Other Ambulatory Visit: Payer: Self-pay

## 2022-12-19 ENCOUNTER — Ambulatory Visit (HOSPITAL_COMMUNITY)
Admission: EM | Admit: 2022-12-19 | Discharge: 2022-12-19 | Disposition: A | Discharge status: Home / Self Care | Payer: Medicaid Other | Attending: Family Medicine | Admitting: Family Medicine

## 2022-12-19 DIAGNOSIS — R062 Wheezing: Secondary | ICD-10-CM | POA: Diagnosis not present

## 2022-12-19 DIAGNOSIS — R051 Acute cough: Secondary | ICD-10-CM

## 2022-12-19 DIAGNOSIS — J4521 Mild intermittent asthma with (acute) exacerbation: Secondary | ICD-10-CM | POA: Diagnosis not present

## 2022-12-19 DIAGNOSIS — R059 Cough, unspecified: Secondary | ICD-10-CM

## 2022-12-19 MED ORDER — IPRATROPIUM-ALBUTEROL 0.5-2.5 (3) MG/3ML IN SOLN
3.0000 mL | Freq: Once | RESPIRATORY_TRACT | Status: AC
  Administered 2022-12-19: 3 mL via RESPIRATORY_TRACT

## 2022-12-19 MED ORDER — LEVOFLOXACIN 500 MG PO TABS: 500.0000 mg | ORAL_TABLET | Freq: Every day | ORAL | 0 refills | Status: AC

## 2022-12-19 MED ORDER — CHERATUSSIN AC 100-10 MG/5ML PO SOLN: 5.0000 mL | Freq: Four times a day (QID) | ORAL | 0 refills | Status: DC | PRN

## 2022-12-19 MED ORDER — METHYLPREDNISOLONE SODIUM SUCC 125 MG IJ SOLR
INTRAMUSCULAR | Status: AC
  Filled 2022-12-19: qty 2

## 2022-12-19 MED ORDER — ALBUTEROL SULFATE HFA 108 (90 BASE) MCG/ACT IN AERS: 2.0000 | INHALATION_SPRAY | RESPIRATORY_TRACT | 0 refills | Status: DC | PRN

## 2022-12-19 MED ORDER — METHYLPREDNISOLONE ACETATE 80 MG/ML IJ SUSP
INTRAMUSCULAR | Status: AC
  Filled 2022-12-19: qty 1

## 2022-12-19 MED ORDER — METHYLPREDNISOLONE ACETATE 80 MG/ML IJ SUSP
80.0000 mg | Freq: Once | INTRAMUSCULAR | Status: AC
  Administered 2022-12-19: 80 mg via INTRAMUSCULAR

## 2022-12-19 MED ORDER — IPRATROPIUM-ALBUTEROL 0.5-2.5 (3) MG/3ML IN SOLN
RESPIRATORY_TRACT | Status: AC
  Filled 2022-12-19: qty 3

## 2022-12-19 NOTE — ED Triage Notes (Signed)
Symptoms started on week ago.  Was seen 2/14 for the same symptoms.  The reason for this visit is worsening of cough, incontinent with coughing, near vomiting, cough seems to be worsening  Patient has taken prescribed medicines from 2/14.  Finished prednisone, continues to take cough medicine

## 2022-12-19 NOTE — ED Provider Notes (Signed)
[place]    CSN: [id number] Arrival date & time: 12/19/22  1305      History   Chief Complaint Chief Complaint  Patient presents with   Cough    HPI Tonya Melton is a 42 y.o. female.    Cough  Here for worsening cough and chest congestion.  On about February 8 she began having nasal congestion and cough and wheezing.  She does have a history of asthma.  She was seen here on February 14; chest x-ray was negative at that time.  She was provided a prescription for prednisone.  She does not feel that she has improved any on the 5 days of prednisone, and she is actually worsened.  She is feeling more congestion in her chest and her nose is very stopped up.  She does have some sinus pressure.  The nebulizers and bronchodilator inhaler are only helping very briefly.  She has not had any fever in the last few days  Past Medical History:  Diagnosis Date   Anxiety    Asthma    Seizures ([place])     There are no problems to display for this patient.   Past Surgical History:  Procedure Laterality Date   CESAREAN SECTION     DENTAL SURGERY     deviated septum  2019    OB History     Gravida  1   Para      Term      Preterm      AB      Living         SAB      IAB      Ectopic      Multiple      Live Births               Home Medications    Prior to Admission medications   Medication Sig Start Date End Date Taking? Authorizing Provider  albuterol (VENTOLIN HFA) 108 (90 Base) MCG/ACT inhaler Inhale 2 puffs into the lungs every 4 (four) hours as needed for wheezing or shortness of breath. 12/19/22  Yes Barrett Henle, MD  FLUoxetine (PROZAC) 20 MG capsule Take 3 capsules by mouth daily. 12/12/22  Yes [provider]  guaiFENesin-codeine (CHERATUSSIN AC) 100-10 MG/5ML syrup Take 5 mLs by mouth 4 (four) times daily as needed for cough. 12/19/22  Yes Margurete Guaman, Gwenlyn Perking, MD  levofloxacin (LEVAQUIN) 500 MG tablet Take 1 tablet (500  mg total) by mouth daily for 7 days. 12/19/22 12/26/22 Yes Taron Mondor, Gwenlyn Perking, MD  mometasone (NASONEX) 50 MCG/ACT nasal spray Place 2 sprays into the nose daily.   Yes [provider]  ipratropium-albuterol (DUONEB) 0.5-2.5 (3) MG/3ML SOLN Take 3 mLs by nebulization 4 (four) times daily.    [provider]  LORazepam (ATIVAN) 0.5 MG tablet Take 0.5 mg by mouth every 6 (six) hours as needed.    [provider]    Family History History reviewed. No pertinent family history.  Social History Social History   Tobacco Use   Smoking status: Never   Smokeless tobacco: Never  Vaping Use   Vaping Use: Never used  Substance Use Topics   Alcohol use: Yes   Drug use: Never     Allergies   Amoxicillin, Biaxin [clarithromycin], Doxycycline, and Penicillins   Review of Systems Review of Systems  Respiratory:  Positive for cough.      Physical Exam Triage Vital Signs ED Triage Vitals  Enc Vitals Group     BP 12/19/22 1505 134/86     Pulse Rate 12/19/22 1505 75     Resp 12/19/22 1505 20     Temp 12/19/22 1505 97.7 F (36.5 C)     Temp Source 12/19/22 1505 Oral     SpO2 12/19/22 1505 98 %     Weight --      Height --      Head Circumference --      Peak Flow --      Pain Score 12/19/22 1500 2     Pain Loc --      Pain Edu? --      Excl. in [facility]? --    No data found.  Updated Vital Signs BP 134/86 (BP Location: Left Arm) Comment (BP Location): large  Pulse 75   Temp 97.7 F (36.5 C) (Oral)   Resp 20   SpO2 98%   Visual Acuity Right Eye Distance:   Left Eye Distance:   Bilateral Distance:    Right Eye Near:   Left Eye Near:    Bilateral Near:     Physical Exam Vitals reviewed.  Constitutional:      General: She is not in acute distress.    Appearance: She is not toxic-appearing or diaphoretic.  HENT:     Right Ear: Tympanic membrane and ear canal normal.     Left Ear: Tympanic membrane and ear canal normal.     Nose: Congestion  present.     Mouth/Throat:     Mouth: Mucous membranes are moist.     Pharynx: No oropharyngeal exudate or posterior oropharyngeal erythema.  Eyes:     Extraocular Movements: Extraocular movements intact.     Conjunctiva/sclera: Conjunctivae normal.     Pupils: Pupils are equal, round, and reactive to light.  Cardiovascular:     Rate and Rhythm: Normal rate and regular rhythm.     Heart sounds: No murmur heard. Pulmonary:     Effort: No respiratory distress.     Breath sounds: No stridor. No rhonchi or rales.     Comments: She has bilateral expiratory wheezes with prolonged expiratory phase. Chest:     Chest wall: No tenderness.  Musculoskeletal:     Cervical back: Neck supple.  Lymphadenopathy:     Cervical: No cervical adenopathy.  Skin:    Capillary Refill: Capillary refill takes less than 2 seconds.     Coloration: Skin is not jaundiced or pale.  Neurological:     General: No focal deficit present.     Mental Status: She is alert and oriented to person, place, and time.  Psychiatric:        Behavior: Behavior normal.      UC Treatments / Results  Labs (all labs ordered are listed, but only abnormal results are displayed) Labs Reviewed - No data to display  EKG   Radiology DG Chest 2 View  Result Date: 12/19/2022 CLINICAL DATA:  Worsening cough and wheezing. EXAM: CHEST - 2 VIEW COMPARISON:  12/14/2022 FINDINGS: The heart size and mediastinal contours are within normal limits. There is no evidence of pulmonary edema, consolidation, pneumothorax, nodule or pleural fluid. The visualized skeletal structures are unremarkable. IMPRESSION: No active cardiopulmonary disease. Electronically Signed   By: Aletta Edouard M.D.   On: 12/19/2022 15:43    Procedures Procedures (including critical care time)  Medications Ordered in UC Medications  methylPREDNISolone acetate (DEPO-MEDROL) injection 80 mg (has no administration in time range)  ipratropium-albuterol (DUONEB)  0.5-2.5 (3) MG/3ML nebulizer solution 3 mL (3 mLs Nebulization Given 12/19/22 1539)    Initial Impression / Assessment and Plan / UC Course  I have reviewed the triage vital signs and the nursing notes.  Pertinent labs & imaging results that were available during my care of the patient were reviewed by me and considered in my medical decision making (see chart for details).        Chest x-ray does not show any pneumonia.  Exam is improved after neb treatment.  I think she has a sinus infection that is continuing the asthma exacerbation.  She has taken Levaquin without difficulty, but has several other antibiotic allergies that cause hives.  Doxycycline makes her nauseated. Final Clinical Impressions(s) / UC Diagnoses   Final diagnoses:  Acute cough  Mild intermittent asthma with exacerbation     Discharge Instructions      Chest x-ray did not show any pneumonia  I do think you have a sinus infection on top of asthma exacerbation.  Albuterol inhaler--do 2 puffs every 4 hours as needed for shortness of breath or wheezing  Levaquin 500 mg--take 1 tablet daily for 7 days  Robitussin with codeine cough syrup--take 5 mL or 1 teaspoon every 6 hours as needed for cough.  You have been given a shot of Depo-Medrol 80 mg     ED Prescriptions     Medication Sig Dispense Auth. Provider   levofloxacin (LEVAQUIN) 500 MG tablet Take 1 tablet (500 mg total) by mouth daily for 7 days. 7 tablet Anhar Mcdermott, Gwenlyn Perking, MD   guaiFENesin-codeine (CHERATUSSIN AC) 100-10 MG/5ML syrup Take 5 mLs by mouth 4 (four) times daily as needed for cough. 120 mL Barrett Henle, MD   albuterol (VENTOLIN HFA) 108 (90 Base) MCG/ACT inhaler Inhale 2 puffs into the lungs every 4 (four) hours as needed for wheezing or shortness of breath. 1 each Barrett Henle, MD      I have reviewed the PDMP during this encounter.   Barrett Henle, MD 12/19/22 [phone]

## 2022-12-19 NOTE — Discharge Instructions (Signed)
Chest x-ray did not show any pneumonia  I do think you have a sinus infection on top of asthma exacerbation.  Albuterol inhaler--do 2 puffs every 4 hours as needed for shortness of breath or wheezing  Levaquin 500 mg--take 1 tablet daily for 7 days  Robitussin with codeine cough syrup--take 5 mL or 1 teaspoon every 6 hours as needed for cough.  You have been given a shot of Depo-Medrol 80 mg

## 2023-02-27 DIAGNOSIS — O99345 Other mental disorders complicating the puerperium: Secondary | ICD-10-CM | POA: Diagnosis not present

## 2023-02-27 DIAGNOSIS — F418 Other specified anxiety disorders: Secondary | ICD-10-CM | POA: Diagnosis not present

## 2023-02-27 DIAGNOSIS — F411 Generalized anxiety disorder: Secondary | ICD-10-CM | POA: Diagnosis not present

## 2023-03-07 DIAGNOSIS — G43109 Migraine with aura, not intractable, without status migrainosus: Secondary | ICD-10-CM | POA: Diagnosis not present

## 2023-11-02 DIAGNOSIS — J4551 Severe persistent asthma with (acute) exacerbation: Secondary | ICD-10-CM | POA: Diagnosis not present

## 2023-11-02 DIAGNOSIS — J4550 Severe persistent asthma, uncomplicated: Secondary | ICD-10-CM | POA: Diagnosis not present

## 2023-11-02 DIAGNOSIS — J310 Chronic rhinitis: Secondary | ICD-10-CM | POA: Diagnosis not present

## 2023-11-03 DIAGNOSIS — N951 Menopausal and female climacteric states: Secondary | ICD-10-CM | POA: Diagnosis not present

## 2023-11-03 DIAGNOSIS — R7303 Prediabetes: Secondary | ICD-10-CM | POA: Diagnosis not present

## 2023-11-03 DIAGNOSIS — J4521 Mild intermittent asthma with (acute) exacerbation: Secondary | ICD-10-CM | POA: Diagnosis not present

## 2023-11-03 DIAGNOSIS — J069 Acute upper respiratory infection, unspecified: Secondary | ICD-10-CM | POA: Diagnosis not present

## 2023-11-03 DIAGNOSIS — E785 Hyperlipidemia, unspecified: Secondary | ICD-10-CM | POA: Diagnosis not present

## 2023-11-03 DIAGNOSIS — R030 Elevated blood-pressure reading, without diagnosis of hypertension: Secondary | ICD-10-CM | POA: Diagnosis not present

## 2023-11-07 DIAGNOSIS — G8929 Other chronic pain: Secondary | ICD-10-CM | POA: Diagnosis not present

## 2023-11-07 DIAGNOSIS — M545 Low back pain, unspecified: Secondary | ICD-10-CM | POA: Diagnosis not present

## 2023-12-27 DIAGNOSIS — G43109 Migraine with aura, not intractable, without status migrainosus: Secondary | ICD-10-CM | POA: Diagnosis not present

## 2023-12-27 DIAGNOSIS — G8929 Other chronic pain: Secondary | ICD-10-CM | POA: Diagnosis not present

## 2023-12-27 DIAGNOSIS — G5603 Carpal tunnel syndrome, bilateral upper limbs: Secondary | ICD-10-CM | POA: Diagnosis not present

## 2024-11-06 ENCOUNTER — Ambulatory Visit (HOSPITAL_COMMUNITY): Admission: EM | Admit: 2024-11-06 | Discharge: 2024-11-06 | Disposition: A | Discharge status: Home / Self Care

## 2024-11-06 ENCOUNTER — Encounter (HOSPITAL_COMMUNITY): Payer: Self-pay | Admitting: Emergency Medicine

## 2024-11-06 DIAGNOSIS — J0141 Acute recurrent pansinusitis: Secondary | ICD-10-CM

## 2024-11-06 DIAGNOSIS — N912 Amenorrhea, unspecified: Secondary | ICD-10-CM

## 2024-11-06 DIAGNOSIS — J4521 Mild intermittent asthma with (acute) exacerbation: Secondary | ICD-10-CM

## 2024-11-06 DIAGNOSIS — Z3202 Encounter for pregnancy test, result negative: Secondary | ICD-10-CM

## 2024-11-06 LAB — POCT URINE PREGNANCY: Preg Test, Ur: NEGATIVE

## 2024-11-06 MED ORDER — IPRATROPIUM-ALBUTEROL 0.5-2.5 (3) MG/3ML IN SOLN: 3.0000 mL | Freq: Four times a day (QID) | RESPIRATORY_TRACT | 0 refills | Status: AC | PRN

## 2024-11-06 MED ORDER — ALBUTEROL SULFATE HFA 108 (90 BASE) MCG/ACT IN AERS: 2.0000 | INHALATION_SPRAY | RESPIRATORY_TRACT | 0 refills | Status: AC | PRN

## 2024-11-06 MED ORDER — LEVOFLOXACIN 750 MG PO TABS: 750.0000 mg | ORAL_TABLET | Freq: Every day | ORAL | 0 refills | Status: AC

## 2024-11-06 MED ORDER — BENZONATATE 100 MG PO CAPS: 100.0000 mg | ORAL_CAPSULE | Freq: Three times a day (TID) | ORAL | 0 refills | Status: AC | PRN

## 2024-11-06 MED ORDER — DOXYCYCLINE HYCLATE 100 MG PO CAPS: 100.0000 mg | ORAL_CAPSULE | Freq: Two times a day (BID) | ORAL | 0 refills | Status: DC

## 2024-11-06 NOTE — Discharge Instructions (Addendum)
 Urine pregnancy test is negative today.  You have a sinus infection.  Take the antibiotic daily for 7 days as prescribed to treat it.  Also recommend continue guaifenesin 600 to 1200 mg twice daily, saline rinses/flushes, lots of hydration with water.  Cough suppressant medication has been sent to the pharmacy as well.  Treat asthma exacerbation with continued breathing treatments or albuterol  inhaler scheduled every 6 hours for the next 2 days, then use as needed.  Seek care if symptoms worsen despite treatment.

## 2024-11-06 NOTE — ED Triage Notes (Signed)
 Pt reports for 2 weeks having productive cough and nasal congestion. Taking Dayquil, Nyquil, Mucinex. Reports feels her asthma has now been flared up requiring to do neb treatments.

## 2024-11-06 NOTE — ED Provider Notes (Signed)
 " [facility]    CSN: [id number] Arrival date & time: 11/06/24  1345      History   Chief Complaint Chief Complaint  Patient presents with   Cough   Nasal Congestion    HPI Tonya Melton is a 44 y.o. female.   Patient presents today with 2-week history of chills, intermittent shortness of breath, wheezing, chest pain when she coughs and soreness at rest, chest tightness, stuffy nose, sinus pressure and headache, bilateral ear pain that alternates and has intermittent muffled hearing as well, and fatigue.  She denies known fevers, runny nose, or new abdominal symptoms.  Has been using nebulizer and albuterol  rescue inhaler with improvement in wheezing and shortness of breath.  Reports history of asthma.  She also reports history of deviated septum status post surgery to repair and prevent recurrent sinusitis.  Reports last sinus infection was more than 6 months ago.  Typically takes azithromycin or Levaquin  for sinus infections.  Patient reports she is currently trying to conceive, recently had IUD removed and had a faint positive line at home on December 7.  Reports she has had negative urine pregnancy test and a normal transvaginal ultrasound.    Past Medical History:  Diagnosis Date   Anxiety    Asthma    Seizures (HCC)     There are no active problems to display for this patient.   Past Surgical History:  Procedure Laterality Date   CESAREAN SECTION     DENTAL SURGERY     deviated septum  2019    OB History     Gravida  1   Para      Term      Preterm      AB      Living         SAB      IAB      Ectopic      Multiple      Live Births               Home Medications    Prior to Admission medications  Medication Sig Start Date End Date Taking? Authorizing Provider  benzonatate  (TESSALON ) 100 MG capsule Take 1 capsule (100 mg total) by mouth 3 (three) times daily as needed for cough. Do not take with alcohol or while operating  or driving heavy machinery 06/01/72  Yes Chandra Raisin A, NP  levofloxacin  (LEVAQUIN ) 750 MG tablet Take 1 tablet (750 mg total) by mouth daily. 11/06/24  Yes Chandra Raisin LABOR, NP  montelukast (SINGULAIR) 10 MG tablet Take 10 mg by mouth. 09/18/14  Yes [provider]  albuterol  (VENTOLIN  HFA) 108 (90 Base) MCG/ACT inhaler Inhale 2 puffs into the lungs every 4 (four) hours as needed for wheezing or shortness of breath. 11/06/24   Chandra Raisin LABOR, NP  DULoxetine (CYMBALTA) 60 MG capsule Take 60 mg by mouth every morning.    [provider]  ipratropium-albuterol  (DUONEB) 0.5-2.5 (3) MG/3ML SOLN Take 3 mLs by nebulization every 6 (six) hours as needed. 11/06/24   Chandra Raisin LABOR, NP  LORazepam  (ATIVAN ) 0.5 MG tablet Take 0.5 mg by mouth every 6 (six) hours as needed.    [provider]  mometasone (NASONEX) 50 MCG/ACT nasal spray Place 2 sprays into the nose daily.    [provider]    Family History No family history on file.  Social History Social History[1]   Allergies   Amoxicillin, Biaxin [clarithromycin], Penicillins, and  Doxycycline    Review of Systems Review of Systems Per HPI  Physical Exam Triage Vital Signs ED Triage Vitals  Encounter Vitals Group     BP 11/06/24 1509 (!) 132/90     Girls Systolic BP Percentile --      Girls Diastolic BP Percentile --      Boys Systolic BP Percentile --      Boys Diastolic BP Percentile --      Pulse Rate 11/06/24 1509 80     Resp 11/06/24 1509 18     Temp 11/06/24 1509 98.4 F (36.9 C)     Temp Source 11/06/24 1509 Oral     SpO2 11/06/24 1509 96 %     Weight --      Height --      Head Circumference --      Peak Flow --      Pain Score 11/06/24 1505 1     Pain Loc --      Pain Education --      Exclude from Growth Chart --    No data found.  Updated Vital Signs BP (!) 132/90 (BP Location: Right Arm)   Pulse 80   Temp 98.4 F (36.9 C) (Oral)   Resp 18   SpO2 96%   Visual  Acuity Right Eye Distance:   Left Eye Distance:   Bilateral Distance:    Right Eye Near:   Left Eye Near:    Bilateral Near:     Physical Exam Vitals and nursing note reviewed.  Constitutional:      General: She is not in acute distress.    Appearance: Normal appearance. She is not ill-appearing or toxic-appearing.  HENT:     Head: Normocephalic and atraumatic.     Right Ear: Tympanic membrane, ear canal and external ear normal.     Left Ear: Tympanic membrane, ear canal and external ear normal.     Nose: Congestion present. No rhinorrhea.     Right Sinus: Maxillary sinus tenderness and frontal sinus tenderness present.     Left Sinus: Maxillary sinus tenderness and frontal sinus tenderness present.     Mouth/Throat:     Mouth: Mucous membranes are moist.     Pharynx: Oropharynx is clear. No oropharyngeal exudate or posterior oropharyngeal erythema.  Eyes:     General: No scleral icterus.    Extraocular Movements: Extraocular movements intact.  Cardiovascular:     Rate and Rhythm: Normal rate and regular rhythm.  Pulmonary:     Effort: Pulmonary effort is normal. No respiratory distress.     Breath sounds: Normal breath sounds. No wheezing, rhonchi or rales.     Comments: Frequent cough with expiration Musculoskeletal:     Cervical back: Normal range of motion and neck supple.  Lymphadenopathy:     Cervical: No cervical adenopathy.  Skin:    General: Skin is warm and dry.     Capillary Refill: Capillary refill takes less than 2 seconds.     Coloration: Skin is not jaundiced or pale.     Findings: No erythema or rash.  Neurological:     Mental Status: She is alert and oriented to person, place, and time.     Motor: No weakness.  Psychiatric:        Behavior: Behavior is cooperative.      UC Treatments / Results  Labs (all labs ordered are listed, but only abnormal results are displayed) Labs Reviewed  POCT URINE PREGNANCY  EKG   Radiology No results  found.  Procedures Procedures (including critical care time)  Medications Ordered in UC Medications - No data to display  Initial Impression / Assessment and Plan / UC Course  I have reviewed the triage vital signs and the nursing notes.  Pertinent labs & imaging results that were available during my care of the patient were reviewed by me and considered in my medical decision making (see chart for details).   Patient is a pleasant, well-appearing 44 year old female presenting today for nasal congestion, productive cough, sinus pressure.  Examination is reassuring and vital signs are stable today.  Urine pregnancy test is negative.  Will treat for recurrent sinusitis with Levaquin  750 mg daily for 7 days; patient has allergies to both doxycycline  and penicillins.  Also recommended Tessalon  Perles for coughing fits and continue albuterol  inhaler or DuoNeb every 6 hours scheduled for the next 2 days.  Refills given for asthma exacerbation.  Return and ER precautions discussed.  The patient was given the opportunity to ask questions.  All questions answered to their satisfaction.  The patient is in agreement to this plan.   Final Clinical Impressions(s) / UC Diagnoses   Final diagnoses:  Amenorrhea  Acute recurrent pansinusitis  Mild intermittent asthma with acute exacerbation  Urine pregnancy test negative     Discharge Instructions      Urine pregnancy test is negative today.  You have a sinus infection.  Take the antibiotic daily for 7 days as prescribed to treat it.  Also recommend continue guaifenesin 600 to 1200 mg twice daily, saline rinses/flushes, lots of hydration with water.  Cough suppressant medication has been sent to the pharmacy as well.  Treat asthma exacerbation with continued breathing treatments or albuterol  inhaler scheduled every 6 hours for the next 2 days, then use as needed.  Seek care if symptoms worsen despite treatment.     ED Prescriptions      Medication Sig Dispense Auth. Provider   albuterol  (VENTOLIN  HFA) 108 (90 Base) MCG/ACT inhaler Inhale 2 puffs into the lungs every 4 (four) hours as needed for wheezing or shortness of breath. 1 each Chandra Harlene LABOR, NP   ipratropium-albuterol  (DUONEB) 0.5-2.5 (3) MG/3ML SOLN Take 3 mLs by nebulization every 6 (six) hours as needed. 360 mL Chandra Harlene A, NP   benzonatate  (TESSALON ) 100 MG capsule Take 1 capsule (100 mg total) by mouth 3 (three) times daily as needed for cough. Do not take with alcohol or while operating or driving heavy machinery 21 capsule Chandra Harlene A, NP   doxycycline  (VIBRAMYCIN ) 100 MG capsule  (Status: Discontinued) Take 1 capsule (100 mg total) by mouth 2 (two) times daily for 7 days. 14 capsule Chandra Harlene A, NP   levofloxacin  (LEVAQUIN ) 750 MG tablet Take 1 tablet (750 mg total) by mouth daily. 7 tablet Chandra Harlene LABOR, NP      PDMP not reviewed this encounter.     [1]  Social History Tobacco Use   Smoking status: Never   Smokeless tobacco: Never  Vaping Use   Vaping status: Never Used  Substance Use Topics   Alcohol use: Yes   Drug use: Never     Chandra Harlene LABOR, NP 11/06/24 1636  "
